# Patient Record
Sex: Female | Born: 1960 | Race: White | Hispanic: No | Marital: Single | Smoking: Never smoker
Health system: Southern US, Community
[De-identification: ages and names within clinical notes are randomized; demographics above are authoritative.]

## PROBLEM LIST (undated history)

## (undated) DIAGNOSIS — M4802 Spinal stenosis, cervical region: Secondary | ICD-10-CM

## (undated) DIAGNOSIS — M5030 Other cervical disc degeneration, unspecified cervical region: Secondary | ICD-10-CM

## (undated) DIAGNOSIS — F419 Anxiety disorder, unspecified: Secondary | ICD-10-CM

## (undated) DIAGNOSIS — K589 Irritable bowel syndrome without diarrhea: Secondary | ICD-10-CM

## (undated) DIAGNOSIS — I10 Essential (primary) hypertension: Secondary | ICD-10-CM

## (undated) DIAGNOSIS — R002 Palpitations: Secondary | ICD-10-CM

## (undated) DIAGNOSIS — G4700 Insomnia, unspecified: Secondary | ICD-10-CM

## (undated) DIAGNOSIS — R079 Chest pain, unspecified: Secondary | ICD-10-CM

## (undated) DIAGNOSIS — K219 Gastro-esophageal reflux disease without esophagitis: Secondary | ICD-10-CM

## (undated) HISTORY — DX: Other cervical disc degeneration, unspecified cervical region: M50.30

## (undated) HISTORY — DX: Palpitations: R00.2

## (undated) HISTORY — DX: Insomnia, unspecified: G47.00

## (undated) HISTORY — DX: Chest pain, unspecified: R07.9

## (undated) HISTORY — DX: Spinal stenosis, cervical region: M48.02

## (undated) HISTORY — DX: Irritable bowel syndrome, unspecified: K58.9

## (undated) HISTORY — DX: Essential (primary) hypertension: I10

## (undated) HISTORY — DX: Anxiety disorder, unspecified: F41.9

## (undated) HISTORY — PX: CATARACT EXTRACTION: SUR2

---

## 2002-04-23 ENCOUNTER — Encounter: Payer: Self-pay | Admitting: Family Medicine

## 2002-04-23 ENCOUNTER — Ambulatory Visit (HOSPITAL_COMMUNITY): Admission: RE | Admit: 2002-04-23 | Discharge: 2002-04-23 | Payer: Self-pay | Admitting: Family Medicine

## 2003-04-26 ENCOUNTER — Ambulatory Visit (HOSPITAL_COMMUNITY): Admission: RE | Admit: 2003-04-26 | Discharge: 2003-04-26 | Payer: Self-pay

## 2003-04-28 ENCOUNTER — Ambulatory Visit (HOSPITAL_COMMUNITY): Admission: RE | Admit: 2003-04-28 | Discharge: 2003-04-28 | Payer: Self-pay

## 2008-03-29 ENCOUNTER — Encounter: Admission: RE | Admit: 2008-03-29 | Discharge: 2008-03-29 | Payer: Self-pay | Admitting: Internal Medicine

## 2008-09-09 ENCOUNTER — Ambulatory Visit: Payer: Self-pay | Admitting: Cardiology

## 2008-12-27 ENCOUNTER — Ambulatory Visit: Payer: Self-pay | Admitting: Cardiology

## 2009-04-13 DIAGNOSIS — R002 Palpitations: Secondary | ICD-10-CM | POA: Insufficient documentation

## 2009-04-13 DIAGNOSIS — R079 Chest pain, unspecified: Secondary | ICD-10-CM | POA: Insufficient documentation

## 2009-12-13 ENCOUNTER — Telehealth (INDEPENDENT_AMBULATORY_CARE_PROVIDER_SITE_OTHER): Payer: Self-pay | Admitting: *Deleted

## 2010-08-02 NOTE — Progress Notes (Signed)
Summary: CLARIFY PROPRANOLOL DOSE  Phone Note From Pharmacy   [facility]  [address]  [phone]* Call For: DOCTOR  Summary of Call: pharmacy states that patient requesting propranolol 10mg  that she take daily and has been getting from them refills. Office note states she is on 80mg . Please clarify which dose she is to be on.   Initial call taken by: Carlye Grippe,  December 13, 2009 9:46 AM  Follow-up for Phone Call        it would be fine to give her prescription for 10 mg daily propranolol. I look sure when the dose was changed but he may ask her. Date the medication dosing in the chart. Follow-up by: Lewayne Bunting, MD, [facility],  December 13, 2009 2:15 PM    New/Updated Medications: PROPRANOLOL HCL 10 MG TABS (PROPRANOLOL HCL) Take 1 tablet by mouth once a day Prescriptions: PROPRANOLOL HCL 10 MG TABS (PROPRANOLOL HCL) Take 1 tablet by mouth once a day  #30 x 6   Entered by:   Carlye Grippe   Authorized by:   Lewayne Bunting, MD, [facility]   Signed by:   Carlye Grippe on 12/13/2009   Method used:   [facility]        [facility]  [phone]* (retail)       [address] [place], [place]  [address]       Ph: [phone] or [phone]       Fax: [phone]   RxID:   [id number]

## 2010-10-03 ENCOUNTER — Other Ambulatory Visit: Payer: Self-pay | Admitting: *Deleted

## 2010-10-03 MED ORDER — PROPRANOLOL HCL ER 80 MG PO CP24: 80.0000 mg | ORAL_CAPSULE | Freq: Every day | ORAL | Status: DC

## 2010-10-03 NOTE — Telephone Encounter (Signed)
Patient request refill on Propranolol.  Advised her that she had not been seen since 12/27/2008.  Will need to make appointment.  Will refill med to get to next visit.  Scheduled OV for 6/7 with GD.  Patient verbalized understanding.

## 2010-10-26 ENCOUNTER — Ambulatory Visit: Payer: Self-pay | Admitting: Cardiology

## 2010-11-13 NOTE — Assessment & Plan Note (Signed)
 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  We have seen the patient for insomnia and palpitations likely  secondary in the setting of generalized anxiety disorder and also  underlying irritable bowel syndrome.  The patient has been experiencing  significant stress, which is partly job related.  As her insomnia was  very bothersome and she was never rested.  We gave the patient  trazodone, which helped, but she has stopped this now.  She is only on  propranolol LA mainly for palpitations.  She has felt significant relief  of her palpitations with Inderal LA and on occasion will take short-  acting Inderal at work, also when her stress levels rise.  She denies  however any chest pain on exertion, orthopnea, or PND.  We felt during  the last office visit that she does not need stress test, as she has a  low pretest probability for coronary artery disease.   MEDICATIONS:  Propranolol LA 80 mg p.o. at bedtime.   PHYSICAL EXAMINATION:  VITAL SIGNS:  Blood pressure 125/79, heart rate  67.  GENERAL:  White female in no apparent distress.  HEENT:  Pupils, eyes clear; conjunctivae clear.  Conjunctivae clear.  NECK:  Supple.  Normal carotid upstroke.  No carotid bruits.  LUNGS:  Clear breath sounds bilaterally.  HEART:  Regular rate and rhythm.  Normal S1 and S2.  No murmurs, rubs,  or gallops.  ABDOMEN:  Soft and nontender.  No rebound or guarding.  Good bowel  sounds.  EXTREMITIES:  No cyanosis, clubbing or edema.  NEUROLOGIC:  The patient is alert, oriented and grossly nonfocal.   PROBLEMS:  1. Insomnia, resolved.  2. Generalized anxiety disorder.  3. Palpitations  secondary to generalized anxiety disorder, resolved on      propranolol.  4. Atypical chest pain, noncardiac.  5. Irritable bowel syndrome.   PLAN:  1. The patient is doing remarkably well with her Inderal LA, which has      helped her with her anxiety, it also helps her sleep at night.  Her      sleep pattern has much improved.  2. The patient has no chest pain after concerning for angina, I do not      thinks she needs stress      testing at the present time.  3. The patient can follow up with [place] on a p.r.n. basis.     Learta Codding, MD,FACC  Electronically Signed    GED/MedQ  DD: 12/27/2008  DT: 12/28/2008  Job #: [id number]   cc:   Gilmore Laroche, MD

## 2010-11-13 NOTE — Assessment & Plan Note (Signed)
[facility]                          [facility] OFFICE NOTE   NAME:Wood, Gina                          MRN:          [id number]  DATE:09/09/2008                            DOB:          1960/11/13    REFERRING PHYSICIAN:  Broadus John T. Pamalee Leyden, MD   REASON FOR CONSULTATION:  Concerned about blood pressure.   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  Lately she has been under quite a bit of stress  due to work-related circumstances.  Particularly, over the last 2 years  when she took the job at [employer], this occurred at around same time her  father died.  The patient finds herself to get very anxious at times  during the day due to stress.  She does have extreme ruminating thoughts  at night and has a hard time sleeping.  She does drink couple glasses of  the wine in the evening mainly to relax herself, but only time to find  herself sleeping immediately and wake up 2 hours later being wide awake.  She then stays up several hours during nighttime and start actually  working.  She also has checking behavior making sure her doors are  closed and also her car has been locked.  Sometimes she questions if she  is forgetful.  Under stressful circumstances, she experiences chest  pressure, but there is no definite exertional chest pain or shortness of  breath.  Her cardiac risk factor profile is short of her blood pressure  quite low.  She has reported HDL of 60-65.  Before the winter, the  patient exercised 3 times a week with fast walking.  When she finds  herself in stressful situations during the daytime, she will take a 10  mg dose of propranolol and with immediate resolution of her stress  relief.  Despite the fact that she has been anxious lately, her IBS has  been under very good control.  She does have significant symptoms of  GERD related to either snacking late at night as well as  drinking wine,  and having late meals.  She also watches TV in bed and falls asleep with  TV on.  Next, the patient also reports palpitations, but no orthopnea,  PND, presyncope or syncope.   PAST MEDICAL HISTORY:  1. Occasional IBS symptoms.  2. Questionable history of anxiety.  3. Spotting between cycles.  4. Bell palsy in 1987.  5. History of skin cancer removal.   ALLERGIES:  PENICILLIN causes hives.   FAMILY HISTORY:  Her mother is in good health.  Father died of heart  disease, also prostate cancer.   SOCIAL HISTORY:  The patient is not married.  She lives in [place].  She does not smoke.  She occasionally drinks alcohol, socially in the  evening as outlined above.   REVIEW OF SYSTEMS:  Otherwise negative and has been reviewed.   PHYSICAL EXAMINATION:  VITAL SIGNS:  Blood pressure is on 162/93, heart  rate 88 beats per minute.  Height is 5 feet  4 inches.  GENERAL:  Overweight white female, but in no apparent distress.  HEENT:  Pupils; eyes are clear.  Conjunctivae are clear.  NECK:  Supple.  Normal carotid upstroke.  No carotid bruits.  No  definite thyromegaly.  LUNGS:  Clear breath sounds bilaterally.  HEART:  Regular rate and rhythm.  Normal S1 and S2.  No murmur, rubs, or  gallops.  ABDOMEN:  Soft and nontender.  No rebound or guarding.  Good bowel  sounds.  EXTREMITIES:  No cyanosis, clubbing, or edema.  NEUROLOGIC:  The patient alert and grossly nonfocal.   PROBLEM LIST:  1. Insomnia.  2. Generalized anxiety disorder.  3. Palpitations secondary to above.  4. Chest pain secondary to above.  5. Irritable bowel syndrome.   PLAN:  1. At this point, I think it is very important to restore the      patient's sleep pattern, which is clearly a problem.  Although, she      does not want to take any type of the antipsychotropic drugs, I do      feel a small dose of trazodone would be most helpful.  I also      discouraged her from drinking alcohol in the evening  as this will      all induce sleep, but will not give a good night rest.  She can      increase her trazodone to 100 mg p.o. daily.  I have told her that      also this drug is non addictive.  2. The patient has a dramatic response to small dose of propranolol      and in light of her hypertension we will start her on Propranolol      LA 80 mg p.o. daily.  I have asked the patient to check her blood      pressure and she can come back next week for a nurse visit to have      her blood pressure evaluated.  3. At this point, I do not think any ischemia testing is needed and we      will follow the patient clinically.     Learta Codding, MD,FACC  Electronically Signed    GED/MedQ  DD: 09/09/2008  DT: 09/10/2008  Job #: [id number]   cc:   W. Madison Hickman, MD

## 2010-12-05 ENCOUNTER — Encounter: Payer: Self-pay | Admitting: Cardiology

## 2010-12-06 ENCOUNTER — Ambulatory Visit (INDEPENDENT_AMBULATORY_CARE_PROVIDER_SITE_OTHER): Payer: PRIVATE HEALTH INSURANCE | Admitting: Cardiology

## 2010-12-06 ENCOUNTER — Encounter: Payer: Self-pay | Admitting: Cardiology

## 2010-12-06 VITALS — BP 124/83 | HR 67 | Ht 63.0 in | Wt 175.0 lb

## 2010-12-06 DIAGNOSIS — K589 Irritable bowel syndrome without diarrhea: Secondary | ICD-10-CM | POA: Insufficient documentation

## 2010-12-06 DIAGNOSIS — R002 Palpitations: Secondary | ICD-10-CM

## 2010-12-06 MED ORDER — HYOSCYAMINE SULFATE 0.125 MG SL SUBL: SUBLINGUAL_TABLET | SUBLINGUAL | Status: DC

## 2010-12-06 NOTE — Assessment & Plan Note (Signed)
Essentially resolved and doing well on long-acting Inderal 80 mg by mouth each bedtime.

## 2010-12-06 NOTE — Patient Instructions (Addendum)
   Continue all current medications.   May use Levsin as needed   Your physician wants you to follow up in:  1 year.  You will receive a reminder letter in the mail one-two months in advance.  If you don't receive a letter, please call our office to schedule the follow up appointment

## 2010-12-06 NOTE — Progress Notes (Signed)
HPI Patient is a 50 year old female with a prior history of some symptoms of irritable bowel syndrome insomnia palpitations. This previously occurred in the setting of significant stress. When she was seen in the office last time in June of 2010 she had significant improved and did not require any further trazodone for insomnia. She continued her Inderal LA which relieved her palpitations and she would take on occasion short-acting Inderal if she had palpitations and increased stress at work. Her last office visit we did not feel that she needed a stress test that showed low probability for coronary artery disease. From a cardiac standpoint has been doing well. She has had no significant recurrent palpitations. She still has problems however with symptoms of IBS, particularly related to stress and certain food items.  Allergies  Allergen Reactions  . Penicillins Hives    Current Outpatient Prescriptions on File Prior to Visit  Medication Sig Dispense Refill  . propranolol (INDERAL LA) 80 MG 24 hr capsule Take 1 capsule (80 mg total) by mouth daily.  30 capsule  3    Past Medical History  Diagnosis Date  . Chest pain, unspecified   . Palpitations   . IBS (irritable bowel syndrome)   . Insomnia   . Anxiety disorder     No past surgical history on file.  No family history on file.  History   Social History  . Marital Status: Single    Spouse Name: N/A    Number of Children: N/A  . Years of Education: N/A   Occupational History  . [employer]    Social History Main Topics  . Smoking status: Never Smoker   . Smokeless tobacco: Never Used  . Alcohol Use: Not on file  . Drug Use: Not on file  . Sexually Active: Not on file   Other Topics Concern  . Not on file   Social History Narrative  . No narrative on file    PHYSICAL EXAM BP 124/83  Pulse 67  Ht 5\' 3"  (1.6 m)  Wt 175 lb (79.379 kg)  BMI 31.00 kg/m2  General: Well-developed, well-nourished in no  distress Head: Normocephalic and atraumatic Eyes:PERRLA/EOMI intact, conjunctiva and lids normal Ears: No deformity or lesions Mouth:normal dentition, normal posterior pharynx Neck: Supple, no JVD.  No masses, thyromegaly or abnormal cervical nodes Lungs: Normal breath sounds bilaterally without wheezing.  Normal percussion Cardiac: regular rate and rhythm with normal S1 and S2, no S3 or S4.  PMI is normal.  No pathological murmurs Abdomen: Normal bowel sounds, abdomen is soft and nontender without masses, organomegaly or hernias noted.  No hepatosplenomegaly MSK: Back normal, normal gait muscle strength and tone normal Vascular: Pulse is normal in all 4 extremities Extremities: No peripheral pitting edema Neurologic: Alert and oriented x 3 Skin: Intact without lesions or rashes Lymphatics: No significant adenopathy Psychologic: Normal affect  ECG: Normal sinus rhythm normal EKG  ASSESSMENT AND PLAN

## 2010-12-06 NOTE — Assessment & Plan Note (Signed)
I gave the patient information regarding consideration of antibiotic therapy for irritable bowel syndrome if she has particularly significant bloating. I told her to make a GI appointment if she would consider this. She may need some hydrogen breath test first. I also gave her information regarding the FODMAP diet which can provide significant symptom relief.

## 2010-12-13 ENCOUNTER — Telehealth: Payer: Self-pay | Admitting: *Deleted

## 2010-12-13 NOTE — Telephone Encounter (Signed)
Message left on voice mail that she wants to go back on Inderal the way she previously was taking.  States the slow release is giving her a headache and not slowing down her heartrate at night.  Heart not feeling right.

## 2010-12-14 ENCOUNTER — Other Ambulatory Visit: Payer: Self-pay | Admitting: *Deleted

## 2010-12-14 MED ORDER — PROPRANOLOL HCL 80 MG PO TABS: 80.0000 mg | ORAL_TABLET | Freq: Every day | ORAL | Status: DC

## 2010-12-14 NOTE — Telephone Encounter (Signed)
Spoke with patient and she requested original rx be called in. Patient stated that MD informed her that she could switch back if she needed to.

## 2010-12-14 NOTE — Telephone Encounter (Signed)
Patient request plain inderal saying the inderal la made her feel bad. Patient informed that original inderal 80mg  would be called in.

## 2011-07-04 ENCOUNTER — Other Ambulatory Visit: Payer: Self-pay | Admitting: Cardiology

## 2012-01-21 ENCOUNTER — Other Ambulatory Visit: Payer: Self-pay | Admitting: Cardiology

## 2012-01-21 ENCOUNTER — Telehealth: Payer: Self-pay | Admitting: *Deleted

## 2012-01-21 NOTE — Telephone Encounter (Signed)
Patient left message on machine yesterday evening at 4:25.  Requesting refill on Propranolol 10mg  [facility], but has the 80mg  ([phone]).  Returned call this morning.  Patient requesting refill on Propranolol 10mg .  States that GD had given this to her for as needed.  Informed her that this medication was not listed in her med list & that she had not been seen in over a year.  Advised her that I could not refill without MD okay.  Offered to scheduled OV.  Placed call on hold to notify girls up front for scheduling, call dropped off before they picked up.

## 2012-01-21 NOTE — Telephone Encounter (Signed)
Opened in err

## 2012-01-21 NOTE — Telephone Encounter (Signed)
[facility]

## 2012-01-22 ENCOUNTER — Telehealth: Payer: Self-pay | Admitting: Cardiology

## 2012-01-22 NOTE — Telephone Encounter (Signed)
Patient called and wanting to schedule appointment. Patient knows that appointment is with Gene and that it is for August 14th, 2013 at 2:00 PM.

## 2012-01-30 ENCOUNTER — Other Ambulatory Visit: Payer: Self-pay | Admitting: Cardiology

## 2012-02-12 ENCOUNTER — Ambulatory Visit: Payer: PRIVATE HEALTH INSURANCE | Admitting: Physician Assistant

## 2012-02-12 ENCOUNTER — Other Ambulatory Visit: Payer: Self-pay | Admitting: *Deleted

## 2012-02-12 MED ORDER — PROPRANOLOL HCL 80 MG PO TABS: 80.0000 mg | ORAL_TABLET | Freq: Every day | ORAL | Status: DC

## 2013-01-25 ENCOUNTER — Other Ambulatory Visit: Payer: Self-pay | Admitting: Cardiology

## 2013-02-22 ENCOUNTER — Telehealth: Payer: Self-pay

## 2013-02-22 NOTE — Telephone Encounter (Signed)
Gina Wood walked into office asking to be seen today or tomorrow to get refills for her medication.  I explained to her that I didn't have anything that soon and I would have to give her first available with Dr. Purvis Sheffield or Dr. Wyline Mood and that they nurses would fill her RX up until she kept her appointment. Once she had been seen they would give her more refills.  She told me no and that she wanted to see Gina Serpe, PA and I explained to her that Gina did not have anything available either with this being his last week.    She started walking out the office and I tried calling her back to try to resolve her urgency and she refused.    She called back and spoke with Gina Serpe, PA and he agreed to see her this Wednesday at 1:00pm.

## 2013-02-24 ENCOUNTER — Ambulatory Visit (INDEPENDENT_AMBULATORY_CARE_PROVIDER_SITE_OTHER): Payer: PRIVATE HEALTH INSURANCE | Admitting: Physician Assistant

## 2013-02-24 ENCOUNTER — Encounter: Payer: Self-pay | Admitting: Physician Assistant

## 2013-02-24 VITALS — BP 132/81 | HR 81 | Ht 63.0 in | Wt 175.0 lb

## 2013-02-24 DIAGNOSIS — I10 Essential (primary) hypertension: Secondary | ICD-10-CM | POA: Insufficient documentation

## 2013-02-24 DIAGNOSIS — R002 Palpitations: Secondary | ICD-10-CM

## 2013-02-24 MED ORDER — PROPRANOLOL HCL 10 MG PO TABS: 10.0000 mg | ORAL_TABLET | ORAL | Status: DC | PRN

## 2013-02-24 MED ORDER — PROPRANOLOL HCL 80 MG PO TABS: 80.0000 mg | ORAL_TABLET | Freq: Every day | ORAL | Status: DC

## 2013-02-24 NOTE — Patient Instructions (Signed)
   Refills sent to pharmacy for Inderal 10mg  & 80mg  tabs Continue all medications.   Your physician wants you to follow up in:  1 year.  You will receive a reminder letter in the mail one-two months in advance.  If you don't receive a letter, please call our office to schedule the follow up appointment

## 2013-02-24 NOTE — Progress Notes (Signed)
Primary Cardiologist: Prentice Docker, MD (new)   HPI: Patient returns to clinic to reestablish with our group, last seen here in June 2012, by Dr. Andee Lineman. She has history of palpitations, treated with long-acting propanolol, and no known history of structural heart disease.  She reports recent episode of recurrent palpitations, in the context of a mild URI, which she states has been the basis for prior episodes of breakthrough palpitations. This has since resolved. She denies any associated symptoms with these brief episodes, which last only a few seconds in duration, but can recur several times during the day. She had run out of her low-dose propanolol, which she had been previously instructed to use as needed for palpitations. She has also since run out of her long-acting 80 mg dose of propanolol.  She otherwise denies any history of exertional CP or SOB. She has never had an echocardiogram, or undergone an exercise stress test. Symptoms  Allergies  Allergen Reactions  . Penicillins Hives    Current Outpatient Prescriptions  Medication Sig Dispense Refill  . famotidine (PEPCID) 20 MG tablet Take 20 mg by mouth as needed.        . hyoscyamine (LEVSIN/SL) 0.125 MG SL tablet May use one tab sublingual up to three times per day as needed  30 tablet  1  . propranolol (INDERAL) 10 MG tablet Take 1 tablet (10 mg total) by mouth as needed.  30 tablet  11  . propranolol (INDERAL) 80 MG tablet Take 1 tablet (80 mg total) by mouth daily.  30 tablet  11   No current facility-administered medications for this visit.    Past Medical History  Diagnosis Date  . Chest pain, unspecified   . Palpitations   . IBS (irritable bowel syndrome)   . Insomnia   . Anxiety disorder   . HTN (hypertension)     No past surgical history on file.  History   Social History  . Marital Status: Single    Spouse Name: N/A    Number of Children: N/A  . Years of Education: N/A   Occupational History  .  [employer]    Social History Main Topics  . Smoking status: Never Smoker   . Smokeless tobacco: Never Used  . Alcohol Use: Not on file  . Drug Use: Not on file  . Sexual Activity: Not on file   Other Topics Concern  . Not on file   Social History Narrative  . No narrative on file    No family history on file.  ROS: no nausea, vomiting; no fever, chills; no melena, hematochezia; no claudication  PHYSICAL EXAM: BP 132/81  Pulse 81  Ht 5\' 3"  (1.6 m)  Wt 175 lb (79.379 kg)  BMI 31.01 kg/m2  SpO2 98% GENERAL: 52 year old female; NAD HEENT: NCAT, PERRLA, EOMI; sclera clear; no xanthelasma NECK: palpable bilateral carotid pulses, no bruits; no JVD; no TM LUNGS: CTA bilaterally CARDIAC: RRR (S1, S2); no significant murmurs; no rubs or gallops ABDOMEN: soft, protuberant EXTREMETIES: no significant peripheral edema SKIN: warm/dry; no obvious rash/lesions MUSCULOSKELETAL: no joint deformity NEURO: no focal deficit; NL affect   EKG:    ASSESSMENT & PLAN:  PALPITATIONS Will renew prescriptions for both short-acting and long-acting propanolol tablets. Patient declined further evaluation at this time with blood work, to assess electrolytes and TSH level, or a baseline echocardiogram, given that she has never been previously evaluated with such. However, she did agree to proceed with these recommendations, in the event of recurrent  palpitations.  HTN (hypertension) Stable on current dose of propanolol.    Gene Lynn Recendiz, PAC

## 2013-02-24 NOTE — Assessment & Plan Note (Signed)
Stable on current dose of propanolol.

## 2013-02-24 NOTE — Assessment & Plan Note (Signed)
Will renew prescriptions for both short-acting and long-acting propanolol tablets. Patient declined further evaluation at this time with blood work, to assess electrolytes and TSH level, or a baseline echocardiogram, given that she has never been previously evaluated with such. However, she did agree to proceed with these recommendations, in the event of recurrent palpitations.

## 2014-02-04 ENCOUNTER — Other Ambulatory Visit: Payer: Self-pay | Admitting: *Deleted

## 2014-02-04 MED ORDER — PROPRANOLOL HCL 80 MG PO TABS: 80.0000 mg | ORAL_TABLET | Freq: Every day | ORAL | Status: DC

## 2014-03-08 ENCOUNTER — Other Ambulatory Visit: Payer: Self-pay | Admitting: *Deleted

## 2014-03-08 MED ORDER — PROPRANOLOL HCL 10 MG PO TABS: 10.0000 mg | ORAL_TABLET | ORAL | Status: DC | PRN

## 2014-11-01 ENCOUNTER — Encounter: Payer: Self-pay | Admitting: Cardiovascular Disease

## 2014-11-01 ENCOUNTER — Ambulatory Visit (INDEPENDENT_AMBULATORY_CARE_PROVIDER_SITE_OTHER): Payer: 59 | Admitting: Cardiovascular Disease

## 2014-11-01 VITALS — BP 120/86 | HR 65 | Ht 64.0 in

## 2014-11-01 DIAGNOSIS — R002 Palpitations: Secondary | ICD-10-CM | POA: Diagnosis not present

## 2014-11-01 DIAGNOSIS — I10 Essential (primary) hypertension: Secondary | ICD-10-CM | POA: Diagnosis not present

## 2014-11-01 MED ORDER — PROPRANOLOL HCL 80 MG PO TABS: 80.0000 mg | ORAL_TABLET | Freq: Every day | ORAL | Status: DC

## 2014-11-01 NOTE — Progress Notes (Signed)
Patient ID: Gina Wood, female   DOB: 11/27/1960, 54 y.o.   MRN: [id number]      SUBJECTIVE: The patient is a 54 year old female who was most recently seen by our group in August 2014. She has a history of palpitations and hypertension.  She has been doing very well and denies chest pain, leg swelling, orthopnea, lightheadedness, dizziness, syncope, and paroxysmal nocturnal dyspnea. She has not experienced palpitations in 5 months.  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 She said while she has a stressful job, she enjoys it.  She has taken propranolol for many years and said it provides a "calming effect". It also helps control her blood pressure.  ECG performed in the office today demonstrates normal sinus rhythm and no arrhythmias.   Review of Systems: As per "subjective", otherwise negative.  Allergies  Allergen Reactions  . Penicillins Hives    Current Outpatient Prescriptions  Medication Sig Dispense Refill  . omeprazole (PRILOSEC OTC) 20 MG tablet Take 40 mg by mouth at bedtime.    . propranolol (INDERAL) 80 MG tablet Take 1 tablet (80 mg total) by mouth daily. 30 tablet 1   No current facility-administered medications for this visit.    Past Medical History  Diagnosis Date  . Chest pain, unspecified   . Palpitations   . IBS (irritable bowel syndrome)   . Insomnia   . Anxiety disorder   . HTN (hypertension)     No past surgical history on file.  History   Social History  . Marital Status: Single    Spouse Name: N/A  . Number of Children: N/A  . Years of Education: N/A   Occupational History  . [employer]    Social History Main Topics  . Smoking status: Never Smoker   . Smokeless tobacco: Never Used  . Alcohol Use: Not on file  . Drug Use: Not on file  . Sexual Activity: Not on file   Other  Topics Concern  . Not on file   Social History Narrative     Filed Vitals:   11/01/14 0809  BP: 120/86  Pulse: 65  Height: 5\' 4"  (1.626 m)    PHYSICAL EXAM General: NAD HEENT: Normal. Neck: No JVD, no thyromegaly. Lungs: Clear to auscultation bilaterally with normal respiratory effort. CV: Nondisplaced PMI.  Regular rate and rhythm, normal S1/S2, no S3/S4, no murmur. No pretibial or periankle edema.  No carotid bruit.  Normal pedal pulses.  Abdomen: Soft, nontender, no hepatosplenomegaly, no distention.  Neurologic: Alert and oriented x 3.  Psych: Normal affect. Skin: Normal. Musculoskeletal: Normal range of motion, no gross deformities. Extremities: No clubbing or cyanosis.   ECG: Most recent ECG reviewed.      ASSESSMENT AND PLAN: 1. Palpitations: Symptomatically stable for several months on propranolol 80 mg. No changes to therapy indicated.  2. Essential HTN: Well controlled on propranolol. No changes.  Dispo: f/u 1 year.  Prentice DockerSuresh Milanie Rosenfield, M.D., F.A.C.C.

## 2014-11-01 NOTE — Patient Instructions (Signed)
Your physician wants you to follow-up in: 1 year with Dr Koneswaran You will receive a reminder letter in the mail two months in advance. If you don't receive a letter, please call our office to schedule the follow-up appointment.    Your physician recommends that you continue on your current medications as directed. Please refer to the Current Medication list given to you today.     Thank you for choosing [facility] !        

## 2015-11-03 ENCOUNTER — Ambulatory Visit (INDEPENDENT_AMBULATORY_CARE_PROVIDER_SITE_OTHER): Payer: 59 | Admitting: Cardiovascular Disease

## 2015-11-03 ENCOUNTER — Encounter: Payer: Self-pay | Admitting: Cardiovascular Disease

## 2015-11-03 VITALS — BP 132/78 | HR 66 | Ht 63.0 in

## 2015-11-03 DIAGNOSIS — I10 Essential (primary) hypertension: Secondary | ICD-10-CM

## 2015-11-03 DIAGNOSIS — Z131 Encounter for screening for diabetes mellitus: Secondary | ICD-10-CM | POA: Diagnosis not present

## 2015-11-03 DIAGNOSIS — Z13220 Encounter for screening for lipoid disorders: Secondary | ICD-10-CM | POA: Diagnosis not present

## 2015-11-03 DIAGNOSIS — R002 Palpitations: Secondary | ICD-10-CM

## 2015-11-03 MED ORDER — PROPRANOLOL HCL 80 MG PO TABS: 80.0000 mg | ORAL_TABLET | Freq: Every day | ORAL | Status: DC

## 2015-11-03 NOTE — Progress Notes (Signed)
Patient ID: Gina Wood, female   DOB: 08/25/1960, 55 y.o.   MRN: [id number]      SUBJECTIVE: The patient presents for routine follow-up. She has a history of palpitations and hypertension.  She has been doing very well and denies chest pain, leg swelling, orthopnea, lightheadedness, dizziness, syncope, and paroxysmal nocturnal dyspnea.  She has not had any palpitations in the past 12 months. She requests labwork as she does not see a PCP.  ECG performed in the office today which I personally reviewed demonstrates normal sinus rhythm with no ischemic ST segment or T-wave abnormalities, nor any arrhythmias.   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 [place] in [place].   Review of Systems: As per "subjective", otherwise negative.  Allergies  Allergen Reactions  . Penicillins Hives    Current Outpatient Prescriptions  Medication Sig Dispense Refill  . omeprazole (PRILOSEC OTC) 20 MG tablet Take 40 mg by mouth at bedtime.    . propranolol (INDERAL) 80 MG tablet Take 1 tablet (80 mg total) by mouth daily. 30 tablet 11   No current facility-administered medications for this visit.    Past Medical History  Diagnosis Date  . Chest pain, unspecified   . Palpitations   . IBS (irritable bowel syndrome)   . Insomnia   . Anxiety disorder   . HTN (hypertension)     No past surgical history on file.  Social History   Social History  . Marital Status: Single    Spouse Name: N/A  . Number of Children: N/A  . Years of Education: N/A   Occupational History  . [employer]    Social History Main Topics  . Smoking status: Never Smoker   . Smokeless tobacco: Never Used  . Alcohol Use: Not on file  . Drug Use: Not on file  . Sexual Activity: Not on file   Other Topics Concern  . Not on file   Social History  Narrative     Filed Vitals:   11/03/15 0807  BP: 132/78  Pulse: 66  Height: 5\' 3"  (1.6 m)  SpO2: 95%    PHYSICAL EXAM General: NAD HEENT: Normal. Neck: No JVD, no thyromegaly. Lungs: Clear to auscultation bilaterally with normal respiratory effort. CV: Nondisplaced PMI.  Regular rate and rhythm, normal S1/S2, no S3/S4, no murmur. No pretibial or periankle edema.  No carotid bruit.   Abdomen: Soft, nontender, obese.  Neurologic: Alert and oriented.  Psych: Normal affect. Skin: Normal. Musculoskeletal: No gross deformities.    ECG: Most recent ECG reviewed.      ASSESSMENT AND PLAN: 1. Palpitations: Symptomatically stable for over a year on propranolol 80 mg. No changes to therapy indicated. As per her request, will check CBC, BMET, lipids, and HbA1C.  2. Essential HTN: Well controlled on propranolol. No changes.  3. Prevention: As per her request, will check CBC, BMET, lipids, and HbA1C.  Dispo: f/u 1 year.   Prentice DockerSuresh Brinsley Wence, M.D., F.A.C.C.

## 2015-11-03 NOTE — Patient Instructions (Signed)
Your physician wants you to follow-up in: 1 year You will receive a reminder letter in the mail two months in advance. If you don't receive a letter, please call our office to schedule the follow-up appointment.    Your physician recommends that you continue on your current medications as directed. Please refer to the Current Medication list given to you today.    If you need a refill on your cardiac medications before your next appointment, please call your pharmacy.    Get FASTING lab work     Thank you for [facility] !

## 2015-11-09 ENCOUNTER — Other Ambulatory Visit: Payer: Self-pay | Admitting: Cardiovascular Disease

## 2016-04-28 ENCOUNTER — Ambulatory Visit (HOSPITAL_COMMUNITY)
Admission: EM | Admit: 2016-04-28 | Discharge: 2016-04-28 | Disposition: A | Discharge status: Home / Self Care | Payer: 59 | Attending: Internal Medicine | Admitting: Internal Medicine

## 2016-04-28 ENCOUNTER — Encounter (HOSPITAL_COMMUNITY): Payer: Self-pay | Admitting: Emergency Medicine

## 2016-04-28 ENCOUNTER — Ambulatory Visit (INDEPENDENT_AMBULATORY_CARE_PROVIDER_SITE_OTHER): Payer: 59

## 2016-04-28 DIAGNOSIS — S62101A Fracture of unspecified carpal bone, right wrist, initial encounter for closed fracture: Secondary | ICD-10-CM

## 2016-04-28 MED ORDER — IBUPROFEN 800 MG PO TABS
800.0000 mg | ORAL_TABLET | Freq: Once | ORAL | Status: AC
  Administered 2016-04-28: 800 mg via ORAL

## 2016-04-28 MED ORDER — IBUPROFEN 800 MG PO TABS
ORAL_TABLET | ORAL | Status: AC
  Filled 2016-04-28: qty 1

## 2016-04-28 NOTE — Progress Notes (Signed)
Orthopedic Tech Progress Note Patient Details:  Gina Wood 10/22/1960 [id number]  Ortho Devices Type of Ortho Device: Ace wrap, Arm sling, Sugartong splint Ortho Device/Splint Location: rue Ortho Device/Splint Interventions: Application   Larrisha Babineau 04/28/2016, 3:37 PM

## 2016-04-28 NOTE — Discharge Instructions (Signed)
ORTHO WILL SEE YOU THIS WEEK TO DETERMINE IS SURGERY IS NEEDED.   KEEP ELEVATED  COLD COMPRESSES FOR SWELLING  TYLENOL FOR PAIN

## 2016-04-28 NOTE — ED Triage Notes (Signed)
The [facility] with a complaint of right wrist pain secondary to a fall that occurred today. The patient stated that she tripped over a bed rail and struck her right arm on the bed railing when she fell.

## 2016-04-28 NOTE — ED Notes (Signed)
Ortho Tech paged.

## 2016-04-28 NOTE — ED Notes (Signed)
Ortho tech advised will be otw shortly.

## 2016-04-28 NOTE — ED Provider Notes (Signed)
CSN: [id number]     Arrival date & time 04/28/16  1229 History   First MD Initiated Contact with Patient 04/28/16 1329     Chief Complaint  Patient presents with  . Fall   (Consider location/radiation/quality/duration/timing/severity/associated sxs/prior Treatment) HPI NP 55 Y/O FEMALE FOOSH RIGHT HAND TODAY ABOUT 1 HOUR PRIOR TO ARRIVAL.  STATES SHE TRIPPED OVER A BED POST.  Past Medical History:  Diagnosis Date  . Anxiety disorder   . Chest pain, unspecified   . HTN (hypertension)   . IBS (irritable bowel syndrome)   . Insomnia   . Palpitations    History reviewed. No pertinent surgical history. History reviewed. No pertinent family history. Social History  Substance Use Topics  . Smoking status: Never Smoker  . Smokeless tobacco: Never Used  . Alcohol use Not on file   OB History    No data available     Review of Systems  Denies: HEADACHE, NAUSEA, ABDOMINAL PAIN, CHEST PAIN, CONGESTION, DYSURIA, SHORTNESS OF BREATH  Allergies  Penicillins  Home Medications   Prior to Admission medications   Medication Sig Start Date End Date Taking? Authorizing Provider  omeprazole (PRILOSEC OTC) 20 MG tablet Take 40 mg by mouth at bedtime.   Yes Historical Provider, MD  propranolol (INDERAL) 80 MG tablet TAKE 1 TABLET BY MOUTH ONCE DAILY 11/09/15  Yes Laqueta LindenSuresh A Koneswaran, MD   Meds Ordered and Administered this Visit  Medications - No data to display  BP 144/77 (BP Location: Left Arm)   Pulse 61   Temp 98 F (36.7 C) (Oral)   Resp 18   SpO2 97%  No data found.   Physical Exam NURSES NOTES AND VITAL SIGNS REVIEWED. CONSTITUTIONAL: Well developed, well nourished, no acute distress HEENT: normocephalic, atraumatic EYES: Conjunctiva normal NECK:normal ROM, supple, no adenopathy PULMONARY:No respiratory distress, normal effort ABDOMINAL: Soft, ND, NT BS+, No CVAT MUSCULOSKELETAL: Normal ROM of all extremities, RIGHT DISTAL RADIUS, SWOLLEN, TENDER, NO VISIBLE OR  PALPABLE DEFORMITY.  SKIN: warm and dry without rash PSYCHIATRIC: Mood and affect, behavior are normal  Urgent Care Course   Clinical Course    Procedures (including critical care time)  Labs Review Labs Reviewed - No data to display  Imaging Review Dg Wrist Complete Right  Result Date: 04/28/2016 CLINICAL DATA:  Pain after trauma. EXAM: RIGHT WRIST - COMPLETE 3+ VIEW COMPARISON:  None. FINDINGS: There is a displaced fracture through the distal radial metaphysis extending into the radiocarpal joint. The dedicated view of the scaphoid is normal with no fracture. Fracture fragments are seen posteriorly along the wrist on the lateral view consistent with a a triquetrum fracture. The distal ulna and remainder of the carpal bones are unremarkable. The visualized metacarpal bones demonstrate no fractures. IMPRESSION: Displaced distal radius fracture. Fracture fragments seen posteriorly on the lateral view most consistent with a triquetrum fracture. Electronically Signed   By: Gerome Samavid  Williams III M.D   On: 04/28/2016 14:19     Visual Acuity Review  Right Eye Distance:   Left Eye Distance:   Bilateral Distance:    Right Eye Near:   Left Eye Near:    Bilateral Near:      DISCUSSED WITH DR. LANDAU, HE IS HAPPY FOR SUGAR TONG AND FOLLOW UP IN OFFICE BY NEXT Wednesday.   ORTHO TECH TO APPLY SUGAR TONG SPLINT.   MDM   1. Closed fracture of right wrist, initial encounter     Patient is reassured that there are no issues that require  transfer to higher level of care at this time or additional tests. Patient is advised to continue home symptomatic treatment. Patient is advised that if there are new or worsening symptoms to attend the emergency department, contact primary care provider, or return to UC. Instructions of care provided discharged home in stable condition.    THIS NOTE WAS GENERATED USING A VOICE RECOGNITION SOFTWARE PROGRAM. ALL REASONABLE EFFORTS  WERE MADE TO PROOFREAD  THIS DOCUMENT FOR ACCURACY.  I have verbally reviewed the discharge instructions with the patient. A printed AVS was given to the patient.  All questions were answered prior to discharge.      Tharon AquasFrank C Marika Mahaffy, PA 04/28/16 1444

## 2016-05-08 ENCOUNTER — Other Ambulatory Visit: Payer: Self-pay | Admitting: Orthopedic Surgery

## 2016-05-09 ENCOUNTER — Encounter (HOSPITAL_BASED_OUTPATIENT_CLINIC_OR_DEPARTMENT_OTHER): Payer: Self-pay | Admitting: *Deleted

## 2016-05-16 ENCOUNTER — Ambulatory Visit (HOSPITAL_BASED_OUTPATIENT_CLINIC_OR_DEPARTMENT_OTHER)
Admission: RE | Admit: 2016-05-16 | Discharge: 2016-05-16 | Disposition: A | Discharge status: Home / Self Care | Payer: 59 | Source: Ambulatory Visit | Attending: Orthopedic Surgery | Admitting: Orthopedic Surgery

## 2016-05-16 ENCOUNTER — Ambulatory Visit (HOSPITAL_BASED_OUTPATIENT_CLINIC_OR_DEPARTMENT_OTHER): Payer: 59 | Admitting: Anesthesiology

## 2016-05-16 ENCOUNTER — Encounter (HOSPITAL_BASED_OUTPATIENT_CLINIC_OR_DEPARTMENT_OTHER): Payer: Self-pay | Admitting: *Deleted

## 2016-05-16 ENCOUNTER — Encounter (HOSPITAL_BASED_OUTPATIENT_CLINIC_OR_DEPARTMENT_OTHER)
Admission: RE | Disposition: A | Discharge status: Home / Self Care | Payer: Self-pay | Source: Ambulatory Visit | Attending: Orthopedic Surgery

## 2016-05-16 DIAGNOSIS — F419 Anxiety disorder, unspecified: Secondary | ICD-10-CM | POA: Diagnosis not present

## 2016-05-16 DIAGNOSIS — G4700 Insomnia, unspecified: Secondary | ICD-10-CM | POA: Insufficient documentation

## 2016-05-16 DIAGNOSIS — Z880 Allergy status to penicillin: Secondary | ICD-10-CM | POA: Insufficient documentation

## 2016-05-16 DIAGNOSIS — W19XXXA Unspecified fall, initial encounter: Secondary | ICD-10-CM | POA: Insufficient documentation

## 2016-05-16 DIAGNOSIS — Y93E9 Activity, other interior property and clothing maintenance: Secondary | ICD-10-CM | POA: Insufficient documentation

## 2016-05-16 DIAGNOSIS — S52571A Other intraarticular fracture of lower end of right radius, initial encounter for closed fracture: Secondary | ICD-10-CM | POA: Insufficient documentation

## 2016-05-16 DIAGNOSIS — K589 Irritable bowel syndrome without diarrhea: Secondary | ICD-10-CM | POA: Insufficient documentation

## 2016-05-16 DIAGNOSIS — Z79899 Other long term (current) drug therapy: Secondary | ICD-10-CM | POA: Diagnosis not present

## 2016-05-16 DIAGNOSIS — Z6841 Body Mass Index (BMI) 40.0 and over, adult: Secondary | ICD-10-CM | POA: Diagnosis not present

## 2016-05-16 DIAGNOSIS — K219 Gastro-esophageal reflux disease without esophagitis: Secondary | ICD-10-CM | POA: Insufficient documentation

## 2016-05-16 HISTORY — DX: Gastro-esophageal reflux disease without esophagitis: K21.9

## 2016-05-16 HISTORY — PX: OPEN REDUCTION INTERNAL FIXATION (ORIF) DISTAL RADIAL FRACTURE: SHX5989

## 2016-05-16 SURGERY — OPEN REDUCTION INTERNAL FIXATION (ORIF) DISTAL RADIUS FRACTURE
Anesthesia: General | Site: Wrist | Laterality: Right

## 2016-05-16 MED ORDER — FENTANYL CITRATE (PF) 100 MCG/2ML IJ SOLN
INTRAMUSCULAR | Status: AC
  Filled 2016-05-16: qty 2

## 2016-05-16 MED ORDER — MEPERIDINE HCL 25 MG/ML IJ SOLN: 6.2500 mg | INTRAMUSCULAR | Status: DC | PRN

## 2016-05-16 MED ORDER — VANCOMYCIN HCL IN DEXTROSE 1-5 GM/200ML-% IV SOLN
1000.0000 mg | INTRAVENOUS | Status: AC
  Administered 2016-05-16: 1000 mg via INTRAVENOUS

## 2016-05-16 MED ORDER — VANCOMYCIN HCL IN DEXTROSE 1-5 GM/200ML-% IV SOLN
INTRAVENOUS | Status: AC
  Filled 2016-05-16: qty 200

## 2016-05-16 MED ORDER — MIDAZOLAM HCL 2 MG/2ML IJ SOLN
INTRAMUSCULAR | Status: AC
  Filled 2016-05-16: qty 2

## 2016-05-16 MED ORDER — LIDOCAINE 2% (20 MG/ML) 5 ML SYRINGE
INTRAMUSCULAR | Status: DC | PRN
  Administered 2016-05-16: 60 mg via INTRAVENOUS

## 2016-05-16 MED ORDER — EPHEDRINE SULFATE 50 MG/ML IJ SOLN
INTRAMUSCULAR | Status: DC | PRN
  Administered 2016-05-16: 10 mg via INTRAVENOUS

## 2016-05-16 MED ORDER — OXYCODONE-ACETAMINOPHEN 7.5-325 MG PO TABS: 1.0000 | ORAL_TABLET | ORAL | 0 refills | Status: DC | PRN

## 2016-05-16 MED ORDER — LACTATED RINGERS IV SOLN: INTRAVENOUS | Status: DC

## 2016-05-16 MED ORDER — FENTANYL CITRATE (PF) 100 MCG/2ML IJ SOLN: 25.0000 ug | INTRAMUSCULAR | Status: DC | PRN

## 2016-05-16 MED ORDER — PROPOFOL 10 MG/ML IV BOLUS
INTRAVENOUS | Status: DC | PRN
  Administered 2016-05-16: 200 mg via INTRAVENOUS

## 2016-05-16 MED ORDER — METOCLOPRAMIDE HCL 5 MG/ML IJ SOLN: 10.0000 mg | Freq: Once | INTRAMUSCULAR | Status: DC | PRN

## 2016-05-16 MED ORDER — MIDAZOLAM HCL 2 MG/2ML IJ SOLN
1.0000 mg | INTRAMUSCULAR | Status: DC | PRN
  Administered 2016-05-16: 2 mg via INTRAVENOUS

## 2016-05-16 MED ORDER — CHLORHEXIDINE GLUCONATE 4 % EX LIQD: 60.0000 mL | Freq: Once | CUTANEOUS | Status: DC

## 2016-05-16 MED ORDER — FENTANYL CITRATE (PF) 100 MCG/2ML IJ SOLN
50.0000 ug | INTRAMUSCULAR | Status: AC | PRN
  Administered 2016-05-16 (×2): 25 ug via INTRAVENOUS
  Administered 2016-05-16: 100 ug via INTRAVENOUS

## 2016-05-16 MED ORDER — LACTATED RINGERS IV SOLN
INTRAVENOUS | Status: DC
  Administered 2016-05-16 (×2): via INTRAVENOUS

## 2016-05-16 MED ORDER — SCOPOLAMINE 1 MG/3DAYS TD PT72: 1.0000 | MEDICATED_PATCH | Freq: Once | TRANSDERMAL | Status: DC | PRN

## 2016-05-16 SURGICAL SUPPLY — 61 items
BIT DRILL 2.0 LNG QUCK RELEASE (BIT) ×1 IMPLANT
BIT DRILL 2.8X5 QR DISP (BIT) ×2 IMPLANT
BLADE MINI RND TIP GREEN BEAV (BLADE) IMPLANT
BLADE SURG 15 STRL LF DISP TIS (BLADE) ×1 IMPLANT
BLADE SURG 15 STRL SS (BLADE) ×1
BNDG COHESIVE 3X5 TAN STRL LF (GAUZE/BANDAGES/DRESSINGS) ×2 IMPLANT
BNDG ESMARK 4X9 LF (GAUZE/BANDAGES/DRESSINGS) ×2 IMPLANT
BNDG GAUZE ELAST 4 BULKY (GAUZE/BANDAGES/DRESSINGS) ×2 IMPLANT
CHLORAPREP W/TINT 26ML (MISCELLANEOUS) ×2 IMPLANT
CORDS BIPOLAR (ELECTRODE) ×2 IMPLANT
COVER BACK TABLE 60X90IN (DRAPES) ×2 IMPLANT
COVER MAYO STAND STRL (DRAPES) ×2 IMPLANT
CUFF TOURNIQUET SINGLE 18IN (TOURNIQUET CUFF) ×2 IMPLANT
DECANTER SPIKE VIAL GLASS SM (MISCELLANEOUS) IMPLANT
DRAPE EXTREMITY T 121X128X90 (DRAPE) ×2 IMPLANT
DRAPE OEC MINIVIEW 54X84 (DRAPES) ×2 IMPLANT
DRAPE SURG 17X23 STRL (DRAPES) ×2 IMPLANT
DRILL 2.0 LNG QUICK RELEASE (BIT) ×2
GAUZE SPONGE 4X4 12PLY STRL (GAUZE/BANDAGES/DRESSINGS) ×2 IMPLANT
GAUZE XEROFORM 1X8 LF (GAUZE/BANDAGES/DRESSINGS) ×2 IMPLANT
GLOVE BIO SURGEON STRL SZ7.5 (GLOVE) ×2 IMPLANT
GLOVE BIOGEL PI IND STRL 7.0 (GLOVE) ×1 IMPLANT
GLOVE BIOGEL PI IND STRL 8 (GLOVE) ×2 IMPLANT
GLOVE BIOGEL PI IND STRL 8.5 (GLOVE) ×1 IMPLANT
GLOVE BIOGEL PI INDICATOR 7.0 (GLOVE) ×1
GLOVE BIOGEL PI INDICATOR 8 (GLOVE) ×2
GLOVE BIOGEL PI INDICATOR 8.5 (GLOVE) ×1
GLOVE SURG ORTHO 8.0 STRL STRW (GLOVE) ×2 IMPLANT
GLOVE SURG SYN 7.5  E (GLOVE) ×1
GLOVE SURG SYN 7.5 E (GLOVE) ×1 IMPLANT
GOWN STRL REUS W/ TWL LRG LVL3 (GOWN DISPOSABLE) ×1 IMPLANT
GOWN STRL REUS W/TWL 2XL LVL3 (GOWN DISPOSABLE) ×4 IMPLANT
GOWN STRL REUS W/TWL LRG LVL3 (GOWN DISPOSABLE) ×1
GOWN STRL REUS W/TWL XL LVL3 (GOWN DISPOSABLE) ×4 IMPLANT
GUIDEWIRE ORTHO 0.054X6 (WIRE) ×6 IMPLANT
NEEDLE PRECISIONGLIDE 27X1.5 (NEEDLE) IMPLANT
NS IRRIG 1000ML POUR BTL (IV SOLUTION) ×2 IMPLANT
PACK BASIN DAY SURGERY FS (CUSTOM PROCEDURE TRAY) ×2 IMPLANT
PAD CAST 3X4 CTTN HI CHSV (CAST SUPPLIES) ×1 IMPLANT
PADDING CAST COTTON 3X4 STRL (CAST SUPPLIES) ×1
PLATE ACULOCK 2 NARROW RT (Plate) ×2 IMPLANT
SCREW 2.3X12MM (Screw) ×2 IMPLANT
SCREW ACTK 2 NL HEX 3.5.11 (Screw) ×2 IMPLANT
SCREW CORTICAL LOCKING 2.3X14M (Screw) ×2 IMPLANT
SCREW CORTICAL LOCKING 2.3X16M (Screw) ×2 IMPLANT
SCREW CORTICAL LOCKING 2.3X18M (Screw) ×1 IMPLANT
SCREW FX16X2.3XLCK SMTH NS CRT (Screw) ×2 IMPLANT
SCREW FX18X2.3XSMTH LCK NS CRT (Screw) ×1 IMPLANT
SCREW NON TOGG 2.3X20MM (Screw) ×2 IMPLANT
SCREW NONLOCK HEX 3.5X12 (Screw) ×4 IMPLANT
SLEEVE SCD COMPRESS KNEE MED (MISCELLANEOUS) ×2 IMPLANT
SLING ARM FOAM STRAP MED (SOFTGOODS) ×2 IMPLANT
SPLINT PLASTER CAST XFAST 3X15 (CAST SUPPLIES) ×10 IMPLANT
SPLINT PLASTER XTRA FASTSET 3X (CAST SUPPLIES) ×10
STOCKINETTE 4X48 STRL (DRAPES) ×2 IMPLANT
SUT ETHILON 4 0 PS 2 18 (SUTURE) ×2 IMPLANT
SUT VICRYL 4-0 PS2 18IN ABS (SUTURE) ×2 IMPLANT
SYR BULB 3OZ (MISCELLANEOUS) ×2 IMPLANT
SYR CONTROL 10ML LL (SYRINGE) IMPLANT
TOWEL OR 17X24 6PK STRL BLUE (TOWEL DISPOSABLE) ×2 IMPLANT
UNDERPAD 30X30 (UNDERPADS AND DIAPERS) ×2 IMPLANT

## 2016-05-16 NOTE — Anesthesia Preprocedure Evaluation (Signed)
Anesthesia Evaluation  Patient identified by MRN, date of birth, ID band Patient awake    Reviewed: Allergy & Precautions, NPO status , Patient's Chart, lab work & pertinent test results  Airway Mallampati: II  TM Distance: >3 FB Neck ROM: Full    Dental no notable dental hx.    Pulmonary neg pulmonary ROS,    Pulmonary exam normal breath sounds clear to auscultation       Cardiovascular hypertension, Pt. on medications Normal cardiovascular exam Rhythm:Regular Rate:Normal     Neuro/Psych negative neurological ROS  negative psych ROS   GI/Hepatic Neg liver ROS, GERD  Medicated and Controlled,  Endo/Other  Morbid obesity  Renal/GU negative Renal ROS  negative genitourinary   Musculoskeletal negative musculoskeletal ROS (+)   Abdominal   Peds negative pediatric ROS (+)  Hematology negative hematology ROS (+)   Anesthesia Other Findings   Reproductive/Obstetrics negative OB ROS                             Anesthesia Physical Anesthesia Plan  ASA: III  Anesthesia Plan: General   Post-op Pain Management: GA combined w/ Regional for post-op pain   Induction: Intravenous  Airway Management Planned: LMA  Additional Equipment:   Intra-op Plan:   Post-operative Plan: Extubation in OR  Informed Consent: I have reviewed the patients History and Physical, chart, labs and discussed the procedure including the risks, benefits and alternatives for the proposed anesthesia with the patient or authorized representative who has indicated his/her understanding and acceptance.   Dental advisory given  Plan Discussed with: CRNA  Anesthesia Plan Comments: (SCB)        Anesthesia Quick Evaluation

## 2016-05-16 NOTE — Brief Op Note (Signed)
05/16/2016  4:26 PM  PATIENT:  Gina Wood  55 y.o. female  PRE-OPERATIVE DIAGNOSIS:  RIGHT DISTAL RADIUS FRACTURE  S52.571A  POST-OPERATIVE DIAGNOSIS:  RIGHT DISTAL RADIUS FRACTURE  S52.571A  PROCEDURE:  Procedure(s): OPEN REDUCTION INTERNAL FIXATION (ORIF) RIGHT  DISTAL RADIAL FRACTURE (Right)  SURGEON:  Surgeon(s) and Role:    * Cindee SaltGary Corianna Avallone, MD - Primary    * Betha LoaKevin Saydie Gerdts, MD - Assisting  PHYSICIAN ASSISTANT:   ASSISTANTS: K Erminie Foulks,MD   ANESTHESIA:   regional and general  EBL:  Total I/O In: 300 [I.V.:300] Out: -   BLOOD ADMINISTERED:none  DRAINS: none   LOCAL MEDICATIONS USED:  NONE  SPECIMEN:  No Specimen  DISPOSITION OF SPECIMEN:  N/A  COUNTS:  YES  TOURNIQUET:   Total Tourniquet Time Documented: Upper Arm (Right) - 56 minutes Total: Upper Arm (Right) - 56 minutes   DICTATION: .Other Dictation: Dictation Number dictated 05/16/16 @ 14:33  PLAN OF CARE: Discharge to home after PACU  PATIENT DISPOSITION:  PACU - hemodynamically stable.

## 2016-05-16 NOTE — Op Note (Signed)
Dictated 05/16/16 @ 16:33

## 2016-05-16 NOTE — Anesthesia Procedure Notes (Signed)
Anesthesia Regional Block:  Supraclavicular block  Pre-Anesthetic Checklist: ,, timeout performed, Correct Patient, Correct Site, Correct Laterality, Correct Procedure, Correct Position, site marked, Risks and benefits discussed,  Surgical consent,  Pre-op evaluation,  At surgeon's request and post-op pain management  Laterality: Right and Upper  Prep: Maximum Sterile Barrier Precautions used, chloraprep       Needles:  Injection technique: Single-shot  Needle Type: Echogenic Stimulator Needle     Needle Length: 10cm 10 cm Needle Gauge: 21 G    Additional Needles:  Procedures: ultrasound guided (picture in chart) Supraclavicular block Narrative:  Injection made incrementally with aspirations every 5 mL.  Performed by: Personally   Additional Notes: Risks, benefits and alternative to block explained extensively.  Patient tolerated procedure well, without complications.      

## 2016-05-16 NOTE — Transfer of Care (Signed)
Immediate Anesthesia Transfer of Care Note  Patient: Gina Wood  Procedure(s) Performed: Procedure(s): OPEN REDUCTION INTERNAL FIXATION (ORIF) RIGHT  DISTAL RADIAL FRACTURE (Right)  Patient Location: PACU  Anesthesia Type:GA combined with regional for post-op pain  Level of Consciousness: sedated  Airway & Oxygen Therapy: Patient Spontanous Breathing and Patient connected to face mask oxygen  Post-op Assessment: Report given to RN and Post -op Vital signs reviewed and stable  Post vital signs: Reviewed and stable  Last Vitals:  Vitals:   05/16/16 1404 05/16/16 1632  BP:  132/80  Pulse: (!) 57 (!) 58  Resp: 18 16  Temp:      Last Pain:  Vitals:   05/16/16 1249  TempSrc: Oral         Complications: No apparent anesthesia complications

## 2016-05-16 NOTE — Op Note (Signed)
I assisted Surgeon(s) and Role:    * Cindee SaltGary Kais Monje, MD - Primary    * Betha LoaKevin Merina Behrendt, MD - Assisting on the Procedure(s): OPEN REDUCTION INTERNAL FIXATION (ORIF) RIGHT  DISTAL RADIAL FRACTURE on 05/16/2016.  I provided assistance on this case as follows: retraction soft tissues, reduction of fracture, placement of hardware.  Electronically signed by: Tami RibasKUZMA,Kaian Fahs R, MD Date: 05/16/2016 Time: 4:28 PM

## 2016-05-16 NOTE — Discharge Instructions (Signed)
°Post Anesthesia Home Care Instructions ° °Activity: °Get plenty of rest for the remainder of the day. A responsible adult should stay with you for 24 hours following the procedure.  °For the next 24 hours, DO NOT: °-Drive a car °-Operate machinery °-Drink alcoholic beverages °-Take any medication unless instructed by your physician °-Make any legal decisions or sign important papers. ° °Meals: °Start with liquid foods such as gelatin or soup. Progress to regular foods as tolerated. Avoid greasy, spicy, heavy foods. If nausea and/or vomiting occur, drink only clear liquids until the nausea and/or vomiting subsides. Call your physician if vomiting continues. ° °Special Instructions/Symptoms: °Your throat may feel dry or sore from the anesthesia or the breathing tube placed in your throat during surgery. If this causes discomfort, gargle with warm salt water. The discomfort should disappear within 24 hours. ° °If you had a scopolamine patch placed behind your ear for the management of post- operative nausea and/or vomiting: ° °1. The medication in the patch is effective for 72 hours, after which it should be removed.  Wrap patch in a tissue and discard in the trash. Wash hands thoroughly with soap and water. °2. You may remove the patch earlier than 72 hours if you experience unpleasant side effects which may include dry mouth, dizziness or visual disturbances. °3. Avoid touching the patch. Wash your hands with soap and water after contact with the patch. °  °Regional Anesthesia Blocks ° °1. Numbness or the inability to move the "blocked" extremity may last from 3-48 hours after placement. The length of time depends on the medication injected and your individual response to the medication. If the numbness is not going away after 48 hours, call your surgeon. ° °2. The extremity that is blocked will need to be protected until the numbness is gone and the  Strength has returned. Because you cannot feel it, you will need  to take extra care to avoid injury. Because it may be weak, you may have difficulty moving it or using it. You may not know what position it is in without looking at it while the block is in effect. ° °3. For blocks in the legs and feet, returning to weight bearing and walking needs to be done carefully. You will need to wait until the numbness is entirely gone and the strength has returned. You should be able to move your leg and foot normally before you try and bear weight or walk. You will need someone to be with you when you first try to ensure you do not fall and possibly risk injury. ° °4. Bruising and tenderness at the needle site are common side effects and will resolve in a few days. ° °5. Persistent numbness or new problems with movement should be communicated to the surgeon or the [facility] ([phone])/ [facility] ([phone]). ° ° °Hand Center Instructions °Hand Surgery ° °Wound Care: °Keep your hand elevated above the level of your heart.  Do not allow it to dangle by your side.  Keep the dressing dry and do not remove it unless your doctor advises you to do so.  He will usually change it at the time of your post-op visit.  Moving your fingers is advised to stimulate circulation but will depend on the site of your surgery.  If you have a splint applied, your doctor will advise you regarding movement. ° °Activity: °Do not drive or operate machinery today.  Rest today and then you may return   to your normal activity and work as indicated by your physician. ° °Diet:  °Drink liquids today or eat a light diet.  You may resume a regular diet tomorrow.   ° °General expectations: °Pain for two to three days. °Fingers may become slightly swollen. ° °Call your doctor if any of the following occur: °Severe pain not relieved by pain medication. °Elevated temperature. °Dressing soaked with blood. °Inability to move fingers. °White or bluish color to fingers. ° °

## 2016-05-16 NOTE — H&P (Signed)
  Gina Wood is an 55 y.o. female.   Chief Complaint: right wrist pain HPI: Gina Wood is a 55 year old right-hand-dominant female who sustained an injury to her right wrist when she was cleaning her town house for sale. The injury occurred approximately October 29. She was seen at our urgent care where x-rays were taken revealing a distal radius fracture right wrist. She was placed in a [place] splint. She is not complaining of pain until last Thursday when she began having an aching pain with a VAS score 3-4/10. She has no prior history of injury. She has no history diabetes thyroid problems arthritis or gout.She was sent for CT scan on her last visit to assess the intra-articular nature of questionable displacement of the fracture fragment.          Past Medical History:  Diagnosis Date  . Anxiety disorder   . Chest pain, unspecified   . GERD (gastroesophageal reflux disease)   . HTN (hypertension)   . IBS (irritable bowel syndrome)   . Insomnia   . Palpitations     History reviewed. No pertinent surgical history.  History reviewed. No pertinent family history. Social History:  reports that she has never smoked. She has never used smokeless tobacco. She reports that she does not drink alcohol or use drugs.  Allergies:  Allergies  Allergen Reactions  . Penicillins Hives    Medications Prior to Admission  Medication Sig Dispense Refill  . omeprazole (PRILOSEC OTC) 20 MG tablet Take 40 mg by mouth at bedtime.    . propranolol (INDERAL) 80 MG tablet TAKE 1 TABLET BY MOUTH ONCE DAILY 30 tablet 11    No results found for this or any previous visit (from the past 48 hour(s)).  No results found.   Pertinent items are noted in HPI.  Height 5\' 3"  (1.6 m), weight 79.4 kg (175 lb).  General appearance: alert, cooperative and appears stated age Head: Normocephalic, without obvious abnormality Neck: no JVD Resp: clear to auscultation bilaterally Cardio: regular rate and rhythm,  S1, S2 normal, no murmur, click, rub or gallop GI: soft, non-tender; bowel sounds normal; no masses,  no organomegaly Extremities: right wrist pain Pulses: 2+ and symmetric Skin: Skin color, texture, turgor normal. No rashes or lesions Neurologic: Grossly normal Incision/Wound: na  Assessment/Plan Assessment:  1. Other closed intra-articular fracture of distal end of right radius   Plan: Her x-rays CT scan are reviewed with her. This reveals a radial styloid fracture. She also has a chip fracture in the dorsum of her triquetrum. The distal radial styloid fracture is displaced approximately 50% of the depth of the radial styloid. This is all volar. We have discussed with her open reduction internal fixation. We will plan on volar plate for a buttress along with pins to the radial styloid. Pre-peri-and postoperative course been discussed along with risks and complications. She is aware that there is no guarantee to the surgery possible the possibility of infection recurrence injury to arteries nerves tendons the possibility of nonunion. He is aware of the potential for removal of the plate and irritation to tendons. This will be scheduled as an outpatient under regional anesthesia      Toniette Devera R 05/16/2016, 12:21 PM

## 2016-05-16 NOTE — Anesthesia Postprocedure Evaluation (Signed)
Anesthesia Post Note  Patient: Everett Graffamara Van  Procedure(s) Performed: Procedure(s) (LRB): OPEN REDUCTION INTERNAL FIXATION (ORIF) RIGHT  DISTAL RADIAL FRACTURE (Right)  Patient location during evaluation: PACU Anesthesia Type: General and Regional Level of consciousness: awake and alert Pain management: pain level controlled Vital Signs Assessment: post-procedure vital signs reviewed and stable Respiratory status: spontaneous breathing, nonlabored ventilation, respiratory function stable and patient connected to nasal cannula oxygen Cardiovascular status: blood pressure returned to baseline and stable Postop Assessment: no signs of nausea or vomiting Anesthetic complications: no    Last Vitals:  Vitals:   05/16/16 1645 05/16/16 1700  BP: 128/73 130/79  Pulse: 67 65  Resp: (!) 25 (!) 22  Temp:      Last Pain:  Vitals:   05/16/16 1700  TempSrc:   PainSc: 0-No pain                 Shelton SilvasKevin D Carreen Milius

## 2016-05-16 NOTE — Progress Notes (Signed)
Assisted Dr. Carignan with right, ultrasound guided, supraclavicular block. Side rails up, monitors on throughout procedure. See vital signs in flow sheet. Tolerated Procedure well. 

## 2016-05-16 NOTE — Anesthesia Procedure Notes (Signed)
Procedure Name: LMA Insertion Date/Time: 05/16/2016 3:20 PM Performed by: Burna CashONRAD, Helix Lafontaine C Pre-anesthesia Checklist: Patient identified, Emergency Drugs available, Suction available and Patient being monitored Patient Re-evaluated:Patient Re-evaluated prior to inductionOxygen Delivery Method: Circle system utilized Preoxygenation: Pre-oxygenation with 100% oxygen Intubation Type: IV induction Ventilation: Mask ventilation without difficulty LMA: LMA inserted LMA Size: 4.0 Number of attempts: 1 Airway Equipment and Method: Bite block Placement Confirmation: positive ETCO2 Tube secured with: Tape Dental Injury: Teeth and Oropharynx as per pre-operative assessment

## 2016-05-17 ENCOUNTER — Encounter (HOSPITAL_BASED_OUTPATIENT_CLINIC_OR_DEPARTMENT_OTHER): Payer: Self-pay | Admitting: Orthopedic Surgery

## 2016-05-17 NOTE — Op Note (Signed)
NAME:  [name], [name]                 ACCOUNT NO.:  [id number]  MEDICAL RECORD NO.:  [id number]  LOCATION:                                 FACILITY:  PHYSICIAN:  Cindee SaltGary Pinkey Mcjunkin, M.D.       DATE OF BIRTH:  10-31-1960  DATE OF PROCEDURE:  05/16/2016 DATE OF DISCHARGE:                              OPERATIVE REPORT   PREOPERATIVE DIAGNOSIS:  Intra-articular fracture, right distal radius, two-part.  POSTOPERATIVE DIAGNOSIS:  Intra-articular fracture, right distal radius, two-part.  OPERATION:  Open reduction and internal fixation radial styloid fracture right distal radius using Acumed volar distal plate.  SURGEON:  Cindee SaltGary Adela Esteban, M.D.  ASSISTANT:  Betha LoaKevin Javar Eshbach, M.D.  ANESTHESIA:  Supraclavicular block general.  ANESTHESIOLOGIST:  Hollis.  HISTORY:  The patient is a 55 year old female, who fell suffering a fracture of her right distal radius approximately 2.5 weeks ago.  She had CT scan done revealing a marked displacement of the radial styloid. She is admitted for open reduction and internal fixation.  Pre, peri, and postoperative course have been discussed along with risks and complications.  She is aware that there is no guarantee to the surgery, the possibility of infection; recurrence of injury to arteries, nerves, tendons; incomplete relief of symptoms and dystrophy.  In the preoperative area, the patient is seen, the extremity marked by both patient and surgeon.  Antibiotic was also given.  PROCEDURE IN DETAIL:  The patient was brought to the operating room, where a general anesthetic was carried out without difficulty.  A supraclavicular block had been carried out in the preoperative area by Dr. Hart RochesterHollis.  She was prepped using ChloraPrep in a supine position with the right arm free.  A 3-minute dry time was allowed.  Time-out taken, confirming the patient and procedure.  A volar incision was made for application of a volar distal radius plate.  This was carried down through  subcutaneous tissue.  Bleeders were electrocauterized with bipolar.  The dissection was carried through the flexor carpi radialis sheath protecting the neurovascular structures.  A moderate amount of scarring was present in the entire area.  With blunt and sharp dissection, this was taken down to the pronator quadratus.  The brachioradialis was then isolated and transected.  The pronator quadratus was then incised on its radial aspect.  The displaced fracture was immediately apparent.  This was debrided.  The periosteum was elevated off this.  A Hohmann retractor placed allowing visualization of the fracture.  The fracture was then reduced and a distal volar plate placed and pinned in position.  X-rays showed that this was slightly rotated, this was readjusted. This appeared to be in good position on AP and lateral x-rays.  The fracture appeared reduced.  The plate was then affixed proximally with a 12-mm standard size screw in the gliding hole. The distal screws were then placed, 2 on the radial side and these were placed as locking screws.  The remaining pegs were placed leaving the most proximal ulnar one non-loaded.  These are measured between 16 and 18 mm.  X-rays taken in AP and lateral direction revealed the fracture had not been captured with the radial screws, these  radial screws were removed.  The fracture was reduced, held in position, re-drilled, and a nonlocking screw was then placed firmly into the radial styloid in a reduced position.  The remaining screws were placed maintaining the position in both AP and lateral directions.  The oblique showed no penetration of the articular surface.  The remaining 2 proximal screws were then affixed, these were 11 and 10 mm each.  X-rays confirmed the position of plate and screws in good position with reduction of the fracture in an anatomic position.  The wound was copiously irrigated with saline.  The pronator quadratus was repaired  with figure-of-eight 4- 0 Vicryl sutures.  The subcutaneous tissue was closed with interrupted 4- 0 Vicryl, and the skin with interrupted 4-0 nylon sutures.  A sterile compressive dressing, dorsal palmar thumb spica splint was applied.  On deflation of the tourniquet, all fingers immediately pinked.  She was taken to the recovery room for observation in satisfactory condition. She will be discharged to home to [facility] [place] in 1 week, on Percocet.          ______________________________ Cindee SaltGary Swara Donze, M.D.     GK/MEDQ  D:  05/16/2016  T:  05/17/2016  Job:  [id number]

## 2016-09-05 ENCOUNTER — Telehealth: Payer: Self-pay | Admitting: Cardiovascular Disease

## 2016-09-05 MED ORDER — PROPRANOLOL HCL 80 MG PO TABS: 80.0000 mg | ORAL_TABLET | Freq: Every day | ORAL | Status: DC

## 2016-09-05 NOTE — Telephone Encounter (Signed)
°*  STAT* If patient is at the pharmacy, call can be transferred to refill team.   1. Which medications need to be refilled? (please list name of each medication and dose if known) propranolol (INDERAL) 80 MG tablet [[id number]][[id number]]   Pt is scheduled to see Dr. Kirtland BouchardK on 5/15 --she just wants to make sure she doesn't run out prior to apt

## 2016-09-05 NOTE — Telephone Encounter (Signed)
Sent in enough for 30 days. We will refill again once she is seen in office.

## 2016-11-12 ENCOUNTER — Encounter: Payer: Self-pay | Admitting: Cardiovascular Disease

## 2016-11-12 ENCOUNTER — Ambulatory Visit (INDEPENDENT_AMBULATORY_CARE_PROVIDER_SITE_OTHER): Payer: 59 | Admitting: Cardiovascular Disease

## 2016-11-12 VITALS — BP 128/78 | HR 69 | Ht 63.5 in

## 2016-11-12 DIAGNOSIS — E782 Mixed hyperlipidemia: Secondary | ICD-10-CM

## 2016-11-12 DIAGNOSIS — I10 Essential (primary) hypertension: Secondary | ICD-10-CM

## 2016-11-12 DIAGNOSIS — R002 Palpitations: Secondary | ICD-10-CM

## 2016-11-12 DIAGNOSIS — Z13220 Encounter for screening for lipoid disorders: Secondary | ICD-10-CM | POA: Diagnosis not present

## 2016-11-12 DIAGNOSIS — Z131 Encounter for screening for diabetes mellitus: Secondary | ICD-10-CM | POA: Diagnosis not present

## 2016-11-12 MED ORDER — PROPRANOLOL HCL 80 MG PO TABS: 80.0000 mg | ORAL_TABLET | Freq: Every day | ORAL | 11 refills | Status: DC

## 2016-11-12 NOTE — Patient Instructions (Signed)
Your physician wants you to follow-up in: 1 year Dr Genevieve NorlanderKoneswarn You will receive a reminder letter in the mail two months in advance. If you don't receive a letter, please call our office to schedule the follow-up appointment.    Your physician recommends that you return for lab work in: Fasting blood work   I refilled your Propranolol     If you need a refill on your cardiac medications before your next appointment, please call your pharmacy.       Thank you for choosing [facility] !

## 2016-11-12 NOTE — Progress Notes (Signed)
SUBJECTIVE: The patient presents for follow-up of palpitations and hypertension.  She injured her right wrist after a fall and had to undergo surgery. She has some mild carpal tunnel symptoms since that time.  She had some feet swelling after 3-1/2 hour car ride when she didn't stop for breaks.  She notes that her mother required a pacemaker.  ECG performed in the office today which I ordered and personally interpreted demonstrates normal sinus rhythm with no ischemic ST segment or T-wave abnormalities, nor any arrhythmias.  The patient denies any symptoms of chest pain, palpitations, shortness of breath, lightheadedness, dizziness, leg swelling, orthopnea, PND, and syncope.    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 Lives in [place].   Review of Systems: As per "subjective", otherwise negative.  Allergies  Allergen Reactions  . Penicillins Hives    Current Outpatient Prescriptions  Medication Sig Dispense Refill  . omeprazole (PRILOSEC OTC) 20 MG tablet Take 40 mg by mouth at bedtime.    . propranolol (INDERAL) 80 MG tablet Take 1 tablet (80 mg total) by mouth daily. 30 tablet 01   No current facility-administered medications for this visit.     Past Medical History:  Diagnosis Date  . Anxiety disorder   . Chest pain, unspecified   . GERD (gastroesophageal reflux disease)   . HTN (hypertension)   . IBS (irritable bowel syndrome)   . Insomnia   . Palpitations     Past Surgical History:  Procedure Laterality Date  . OPEN REDUCTION INTERNAL FIXATION (ORIF) DISTAL RADIAL FRACTURE Right 05/16/2016   Procedure: OPEN REDUCTION INTERNAL FIXATION (ORIF) RIGHT  DISTAL RADIAL FRACTURE;  Surgeon: Cindee SaltGary Kuzma, MD;  Location: [facility];  Service: Orthopedics;  Laterality: Right;    Social History     Social History  . Marital status: Single    Spouse name: N/A  . Number of children: N/A  . Years of education: N/A   Occupational History  . [employer]    Social History Main Topics  . Smoking status: Never Smoker  . Smokeless tobacco: Never Used  . Alcohol use No  . Drug use: No  . Sexual activity: Not on file   Other Topics Concern  . Not on file   Social History Narrative  . No narrative on file     Vitals:   11/12/16 0810  BP: 128/78  Pulse: 69  SpO2: 98%  Height: 5' 3.5" (1.613 m)    Wt Readings from Last 3 Encounters:  05/16/16 233 lb (105.7 kg)  02/24/13 175 lb (79.4 kg)  12/06/10 175 lb (79.4 kg)     PHYSICAL EXAM General: NAD HEENT: Normal. Neck: No JVD, no thyromegaly. Lungs: Clear to auscultation bilaterally with normal respiratory effort. CV: Nondisplaced PMI.  Regular rate and rhythm, normal S1/S2, no S3/S4, no murmur. No pretibial or periankle edema.  No carotid bruit.   Abdomen: Soft, nontender, no distention.  Neurologic: Alert and oriented.  Psych: Normal affect. Skin: Normal. Musculoskeletal: No gross deformities.    ECG: Most recent ECG reviewed.   Labs: No results found for: K, BUN, CREATININE, ALT, TSH, HGB   Lipids: No results found for: LDLCALC, LDLDIRECT, CHOL, TRIG, HDL     ASSESSMENT AND PLAN:  1. Palpitations: Symptomatically stable on propranolol 80 mg. No changes to therapy. As per  her request, I will check CBC, basic metabolic panel, lipids, and HbA1c.  2. Essential HTN: Controlled on propranolol. No changes to therapy.  3. Prevention: As per her request, I will check CBC, basic metabolic panel, lipids, and HbA1c.    Disposition: Follow up 1 year.  Prentice Docker, M.D., F.A.C.C.

## 2017-09-15 ENCOUNTER — Other Ambulatory Visit: Payer: Self-pay | Admitting: Cardiovascular Disease

## 2017-11-27 ENCOUNTER — Ambulatory Visit: Payer: 59 | Admitting: Cardiovascular Disease

## 2017-11-27 ENCOUNTER — Encounter: Payer: Self-pay | Admitting: Cardiovascular Disease

## 2017-11-27 VITALS — BP 125/79 | HR 62 | Ht 63.5 in

## 2017-11-27 DIAGNOSIS — I10 Essential (primary) hypertension: Secondary | ICD-10-CM

## 2017-11-27 DIAGNOSIS — Z13220 Encounter for screening for lipoid disorders: Secondary | ICD-10-CM

## 2017-11-27 DIAGNOSIS — R002 Palpitations: Secondary | ICD-10-CM

## 2017-11-27 DIAGNOSIS — Z131 Encounter for screening for diabetes mellitus: Secondary | ICD-10-CM

## 2017-11-27 NOTE — Progress Notes (Signed)
SUBJECTIVE: The patient presents for follow-up of palpitations and hypertension.  ECG performed in the office today which I ordered and personally interpreted demonstrates normal sinus rhythm with no ischemic ST segment or T-wave abnormalities, nor any arrhythmias.  She feels well overall.  She denies chest pain, orthopnea, dizziness, syncope, and shortness of breath.  She very seldom has palpitations.  Her biggest challenge over the past several months has been taking care of her elderly mother.  She is now living in assisted living at [facility] in [place].  She is very happy with the care her mother is receiving there.     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 Lives in [place].   Review of Systems: As per "subjective", otherwise negative.  Allergies  Allergen Reactions  . Penicillins Hives    Current Outpatient Medications  Medication Sig Dispense Refill  . omeprazole (PRILOSEC OTC) 20 MG tablet Take 40 mg by mouth at bedtime.    . propranolol (INDERAL) 80 MG tablet TAKE 1 TABLET BY MOUTH EVERY DAY 30 tablet 10   No current facility-administered medications for this visit.     Past Medical History:  Diagnosis Date  . Anxiety disorder   . Chest pain, unspecified   . GERD (gastroesophageal reflux disease)   . HTN (hypertension)   . IBS (irritable bowel syndrome)   . Insomnia   . Palpitations     Past Surgical History:  Procedure Laterality Date  . OPEN REDUCTION INTERNAL FIXATION (ORIF) DISTAL RADIAL FRACTURE Right 05/16/2016   Procedure: OPEN REDUCTION INTERNAL FIXATION (ORIF) RIGHT  DISTAL RADIAL FRACTURE;  Surgeon: Cindee Salt, MD;  Location: [facility];  Service: Orthopedics;  Laterality: Right;    Social History   Socioeconomic History  . Marital status: Single    Spouse  name: Not on file  . Number of children: Not on file  . Years of education: Not on file  . Highest education level: Not on file  Occupational History  . Occupation: [employer]  Social Needs  . Financial resource strain: Not on file  . Food insecurity:    Worry: Not on file    Inability: Not on file  . Transportation needs:    Medical: Not on file    Non-medical: Not on file  Tobacco Use  . Smoking status: Never Smoker  . Smokeless tobacco: Never Used  Substance and Sexual Activity  . Alcohol use: Yes    Alcohol/week: 4.2 oz    Types: 7 Glasses of wine per week  . Drug use: No  . Sexual activity: Not on file  Lifestyle  . Physical activity:    Days per week: Not on file    Minutes per session: Not on file  . Stress: Not on file  Relationships  . Social connections:    Talks on phone: Not on file    Gets together: Not on file    Attends religious service: Not on file    Active member of club or organization: Not on file    Attends meetings of clubs or organizations: Not on file    Relationship status: Not on file  . Intimate partner violence:    Fear of current or ex partner: Not on file    Emotionally abused: Not on file    Physically abused: Not  on file    Forced sexual activity: Not on file  Other Topics Concern  . Not on file  Social History Narrative  . Not on file     Vitals:   11/27/17 0818  BP: 125/79  Pulse: 62  SpO2: 98%  Height: 5' 3.5" (1.613 m)    Wt Readings from Last 3 Encounters:  05/16/16 233 lb (105.7 kg)  02/24/13 175 lb (79.4 kg)  12/06/10 175 lb (79.4 kg)     PHYSICAL EXAM General: NAD HEENT: Normal. Neck: No JVD, no thyromegaly. Lungs: Clear to auscultation bilaterally with normal respiratory effort. CV: Regular rate and rhythm, normal S1/S2, no S3/S4, no murmur. No pretibial or periankle edema.  No carotid bruit.   Abdomen: Soft, nontender, no distention.  Neurologic: Alert and oriented.  Psych: Normal affect. Skin:  Normal. Musculoskeletal: No gross deformities.    ECG: Most recent ECG reviewed.   Labs: No results found for: K, BUN, CREATININE, ALT, TSH, HGB   Lipids: No results found for: LDLCALC, LDLDIRECT, CHOL, TRIG, HDL     ASSESSMENT AND PLAN:  1. Palpitations: Symptomatically stable on propranolol 80 mg. No changes to therapy. As per her request, I will check CBC, comprehensive metabolic panel, lipids, TSH, and HbA1c.  2. Essential HTN: Controlled on propranolol. No changes to therapy.  3. Prevention: As per her request, I will check CBC, comprehensive metabolic panel, lipids, TSH, and HbA1c.    Disposition: Follow up 1 year   Prentice Docker, M.D., F.A.C.C.

## 2017-11-27 NOTE — Patient Instructions (Signed)
Your physician wants you to follow-up in:  1 year with Dr.Koneswaran You will receive a reminder letter in the mail two months in advance. If you don't receive a letter, please call our office to schedule the follow-up appointment.    Your physician recommends that you continue on your current medications as directed. Please refer to the Current Medication list given to you today.    If you need a refill on your cardiac medications before your next appointment, please call your pharmacy.     Get FASTING lab work     No tests ordered today      Thank you for choosing [facility] !

## 2018-01-08 ENCOUNTER — Other Ambulatory Visit (HOSPITAL_COMMUNITY)
Admission: RE | Admit: 2018-01-08 | Discharge: 2018-01-08 | Disposition: A | Discharge status: Home / Self Care | Payer: 59 | Source: Ambulatory Visit | Attending: Cardiovascular Disease | Admitting: Cardiovascular Disease

## 2018-01-08 ENCOUNTER — Encounter: Payer: Self-pay | Admitting: Cardiovascular Disease

## 2018-01-08 DIAGNOSIS — I10 Essential (primary) hypertension: Secondary | ICD-10-CM | POA: Insufficient documentation

## 2018-01-08 LAB — COMPREHENSIVE METABOLIC PANEL
ALT: 22 U/L (ref 0–44)
AST: 19 U/L (ref 15–41)
Albumin: 4 g/dL (ref 3.5–5.0)
Alkaline Phosphatase: 87 U/L (ref 38–126)
Anion gap: 9 (ref 5–15)
BUN: 22 mg/dL — ABNORMAL HIGH (ref 6–20)
CO2: 21 mmol/L — ABNORMAL LOW (ref 22–32)
Calcium: 9.1 mg/dL (ref 8.9–10.3)
Chloride: 110 mmol/L (ref 98–111)
Creatinine, Ser: 0.76 mg/dL (ref 0.44–1.00)
GFR calc Af Amer: >60 mL/min (ref >60)
GFR calc non Af Amer: >60 mL/min (ref >60)
Glucose, Bld: 135 mg/dL — ABNORMAL HIGH (ref 70–99)
Potassium: 4.5 mmol/L (ref 3.5–5.1)
Sodium: 140 mmol/L (ref 135–145)
Total Bilirubin: 0.6 mg/dL (ref 0.3–1.2)
Total Protein: 7.7 g/dL (ref 6.5–8.1)

## 2018-01-08 LAB — CBC
HCT: 46.5 % — ABNORMAL HIGH (ref 36.0–46.0)
Hemoglobin: 15.5 g/dL — ABNORMAL HIGH (ref 12.0–15.0)
MCH: 30.3 pg (ref 26.0–34.0)
MCHC: 33.3 g/dL (ref 30.0–36.0)
MCV: 91 fL (ref 78.0–100.0)
Platelets: 290 10*3/uL (ref 150–400)
RBC: 5.11 MIL/uL (ref 3.87–5.11)
RDW: 14.2 % (ref 11.5–15.5)
WBC: 7.8 10*3/uL (ref 4.0–10.5)

## 2018-01-08 LAB — LIPID PANEL
Cholesterol: 212 mg/dL — ABNORMAL HIGH (ref 0–200)
HDL: 53 mg/dL (ref >40)
LDL Cholesterol: 135 mg/dL — ABNORMAL HIGH (ref 0–99)
Total CHOL/HDL Ratio: 4 RATIO
Triglycerides: 122 mg/dL (ref <150)
VLDL: 24 mg/dL (ref 0–40)

## 2018-01-08 LAB — HEMOGLOBIN A1C
Hgb A1c MFr Bld: 5.8 % — ABNORMAL HIGH (ref 4.8–5.6)
Mean Plasma Glucose: 119.76 mg/dL

## 2018-01-08 LAB — TSH: TSH: 2.18 u[IU]/mL (ref 0.350–4.500)

## 2018-01-23 ENCOUNTER — Other Ambulatory Visit (HOSPITAL_COMMUNITY)
Admission: RE | Admit: 2018-01-23 | Discharge: 2018-01-23 | Disposition: A | Discharge status: Home / Self Care | Payer: 59 | Source: Other Acute Inpatient Hospital | Attending: Urology | Admitting: Urology

## 2018-01-23 ENCOUNTER — Ambulatory Visit: Payer: 59 | Admitting: Urology

## 2018-01-23 DIAGNOSIS — N209 Urinary calculus, unspecified: Secondary | ICD-10-CM | POA: Diagnosis not present

## 2018-01-23 LAB — URINALYSIS, COMPLETE (UACMP) WITH MICROSCOPIC
Bacteria, UA: NONE SEEN
Bilirubin Urine: NEGATIVE
Glucose, UA: NEGATIVE mg/dL
Hgb urine dipstick: NEGATIVE
Ketones, ur: NEGATIVE mg/dL
Nitrite: NEGATIVE
Protein, ur: NEGATIVE mg/dL
Specific Gravity, Urine: 1.012 (ref 1.005–1.030)
pH: 5 (ref 5.0–8.0)

## 2018-01-24 LAB — URINE CULTURE

## 2018-02-02 ENCOUNTER — Other Ambulatory Visit: Payer: Self-pay | Admitting: Urology

## 2018-02-02 DIAGNOSIS — N200 Calculus of kidney: Secondary | ICD-10-CM

## 2018-02-03 ENCOUNTER — Other Ambulatory Visit: Payer: Self-pay | Admitting: Urology

## 2018-02-03 DIAGNOSIS — N200 Calculus of kidney: Secondary | ICD-10-CM

## 2018-02-11 ENCOUNTER — Ambulatory Visit (HOSPITAL_COMMUNITY)
Admission: RE | Admit: 2018-02-11 | Discharge: 2018-02-11 | Disposition: A | Discharge status: Home / Self Care | Payer: 59 | Source: Ambulatory Visit | Attending: Urology | Admitting: Urology

## 2018-02-11 DIAGNOSIS — K8020 Calculus of gallbladder without cholecystitis without obstruction: Secondary | ICD-10-CM | POA: Insufficient documentation

## 2018-02-11 DIAGNOSIS — N200 Calculus of kidney: Secondary | ICD-10-CM | POA: Diagnosis not present

## 2018-08-28 ENCOUNTER — Other Ambulatory Visit: Payer: Self-pay

## 2018-08-28 MED ORDER — PROPRANOLOL HCL 80 MG PO TABS: 80.0000 mg | ORAL_TABLET | Freq: Every day | ORAL | 1 refills | Status: DC

## 2018-08-28 NOTE — Telephone Encounter (Signed)
[facility]

## 2018-11-03 ENCOUNTER — Other Ambulatory Visit: Payer: Self-pay | Admitting: Cardiovascular Disease

## 2018-11-03 MED ORDER — PROPRANOLOL HCL 80 MG PO TABS: 80.0000 mg | ORAL_TABLET | Freq: Every day | ORAL | 1 refills | Status: DC

## 2018-11-03 NOTE — Telephone Encounter (Signed)
° °  1. Which medications need to be refilled? (please list name of each medication and dose if known) propranolol (INDERAL) 80 MG    2. Which pharmacy/location (including street and city if local pharmacy) is medication to be sent to?  [facility] [place] [place]   3. Do they need a 30 day or 90 day supply?   90

## 2018-11-03 NOTE — Telephone Encounter (Signed)
Refilled inderal as requested

## 2018-12-02 ENCOUNTER — Telehealth: Payer: Self-pay | Admitting: Cardiovascular Disease

## 2018-12-02 NOTE — Telephone Encounter (Signed)
Virtual Visit Pre-Appointment Phone Call  "(Name), I am calling you today to discuss your upcoming appointment. We are currently trying to limit exposure to the virus that causes COVID-19 by seeing patients at home rather than in the office."  1. "What is the BEST phone number to call the day of the visit?" - include this in appointment notes  2. Do you have or have access to (through a family member/friend) a smartphone with video capability that we can use for your visit?" a. If yes - list this number in appt notes as cell (if different from BEST phone #) and list the appointment type as a VIDEO visit in appointment notes b. If no - list the appointment type as a PHONE visit in appointment notes  Confirm consent - "In the setting of the current Covid19 crisis, you are scheduled for a (phone or video) visit with your provider on (date) at (time).  Just as we do with many in-office visits, in order for you to participate in this visit, we must obtain consent.  If you'd like, I can send this to your mychart (if signed up) or email for you to review.  Otherwise, I can obtain your verbal consent now.  All virtual visits are billed to your insurance company just like a normal visit would be.  By agreeing to a virtual visit, we'd like you to understand that the technology does not allow for your provider to perform an examination, and thus may limit your provider's ability to fully assess your condition. If your provider identifies any concerns that need to be evaluated in person, we will make arrangements to do so.  Finally, though the technology is pretty good, we cannot assure that it will always work on either your or our end, and in the setting of a video visit, we may have to convert it to a phone-only visit.  In either situation, we cannot ensure that we have a secure connection.  Are you willing to proceed?" STAFF: Did the patient verbally acknowledge consent to telehealth visit? Document  YES/NO here: yes  3. Advise patient to be prepared - "Two hours prior to your appointment, go ahead and check your blood pressure, pulse, oxygen saturation, and your weight (if you have the equipment to check those) and write them all down. When your visit starts, your provider will ask you for this information. If you have an Apple Watch or Kardia device, please plan to have heart rate information ready on the day of your appointment. Please have a pen and paper handy nearby the day of the visit as well."  4. Give patient instructions for MyChart download to smartphone OR [email or web address] as below if video visit (depending on what platform provider is using)  5. Inform patient they will receive a phone call 15 minutes prior to their appointment time (may be from unknown caller ID) so they should be prepared to answer    TELEPHONE CALL NOTE  Gina Wood has been deemed a candidate for a follow-up tele-health visit to limit community exposure during the Covid-19 pandemic. I spoke with the patient via phone to ensure availability of phone/video source, confirm preferred email & phone number, and discuss instructions and expectations.  I reminded Gina Graffamara Gartland to be prepared with any vital sign and/or heart rhythm information that could potentially be obtained via home monitoring, at the time of her visit. I reminded Gina Graffamara Costanzo to expect a phone call prior to her visit.  Aurther Lofterry  L Goins 12/02/2018 2:48 PM

## 2018-12-07 ENCOUNTER — Ambulatory Visit: Payer: Self-pay | Admitting: Cardiovascular Disease

## 2018-12-08 ENCOUNTER — Encounter: Payer: Self-pay | Admitting: Cardiovascular Disease

## 2018-12-08 ENCOUNTER — Telehealth (INDEPENDENT_AMBULATORY_CARE_PROVIDER_SITE_OTHER): Payer: Managed Care, Other (non HMO) | Admitting: Cardiovascular Disease

## 2018-12-08 ENCOUNTER — Other Ambulatory Visit: Payer: Self-pay

## 2018-12-08 VITALS — Ht 63.5 in

## 2018-12-08 DIAGNOSIS — R002 Palpitations: Secondary | ICD-10-CM

## 2018-12-08 DIAGNOSIS — E7800 Pure hypercholesterolemia, unspecified: Secondary | ICD-10-CM

## 2018-12-08 DIAGNOSIS — R7303 Prediabetes: Secondary | ICD-10-CM

## 2018-12-08 DIAGNOSIS — I10 Essential (primary) hypertension: Secondary | ICD-10-CM

## 2018-12-08 NOTE — Progress Notes (Signed)
Virtual Visit via Telephone Note   This visit type was conducted due to national recommendations for restrictions regarding the COVID-19 Pandemic (e.g. social distancing) in an effort to limit this patient's exposure and mitigate transmission in our community.  Due to her co-morbid illnesses, this patient is at least at moderate risk for complications without adequate follow up.  This format is felt to be most appropriate for this patient at this time.  The patient did not have access to video technology/had technical difficulties with video requiring transitioning to audio format only (telephone).  All issues noted in this document were discussed and addressed.  No physical exam could be performed with this format.  Please refer to the patient's chart for her  consent to telehealth for [facility].   Date:  12/08/2018   ID:  Gina Wood, DOB [birth year], MRN [id number]  Patient Location: Home Provider Location: Home  PCP:  Patient, No Pcp Per  Cardiologist:  Kate Sable, MD  Electrophysiologist:  None   Evaluation Performed:  Follow-Up Visit  Chief Complaint:  palpitations  History of Present Illness:    Gina Wood is a 58 y.o. female with palpitations and hypertension.  She was furloughed in mid-April. She is hoping to return to work in July.  Her mother is in assisted living at [facility] in [place].  She's had some insomnia. She's been very careful to wear a mask when going out.  The patient denies any symptoms of chest pain, palpitations, shortness of breath, lightheadedness, dizziness, leg swelling, orthopnea, PND, and syncope.  She does have restless leg syndrome (x 10-20 yrs). She notices this when driving to her house in [place] which is about a 2 hour drive. Applying heat to her legs helps.  The patient does not have symptoms concerning for COVID-19 infection (fever, chills, cough, or new shortness of breath).   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 Lives in [place].  Past Medical History:  Diagnosis Date  . Anxiety disorder   . Chest pain, unspecified   . GERD (gastroesophageal reflux disease)   . HTN (hypertension)   . IBS (irritable bowel syndrome)   . Insomnia   . Palpitations    Past Surgical History:  Procedure Laterality Date  . OPEN REDUCTION INTERNAL FIXATION (ORIF) DISTAL RADIAL FRACTURE Right 05/16/2016   Procedure: OPEN REDUCTION INTERNAL FIXATION (ORIF) RIGHT  DISTAL RADIAL FRACTURE;  Surgeon: Daryll Brod, MD;  Location: [facility];  Service: Orthopedics;  Laterality: Right;     Current Meds  Medication Sig  . omeprazole (PRILOSEC OTC) 20 MG tablet Take 40 mg by mouth at bedtime.  . propranolol (INDERAL) 80 MG tablet Take 1 tablet (80 mg total) by mouth daily.     Allergies:   Penicillins   Social History   Tobacco Use  . Smoking status: Never Smoker  . Smokeless tobacco: Never Used  Substance Use Topics  . Alcohol use: Yes    Alcohol/week: 7.0 standard drinks    Types: 7 Glasses of wine per week  . Drug use: No     Family Hx: The patient's family history includes Hypertension in her father; Stroke in her maternal grandfather.  ROS:   Please see the history of present illness.     All other systems reviewed and are negative.   Prior CV studies:  The following studies were reviewed today:  NA  Labs/Other Tests and Data Reviewed:    EKG:  No ECG reviewed.  Recent Labs: 01/08/2018: ALT 22; BUN 22; Creatinine, Ser 0.76; Hemoglobin 15.5; Platelets 290; Potassium 4.5; Sodium 140; TSH 2.180   Recent Lipid Panel Lab Results  Component Value Date/Time   CHOL 212 (H) 01/08/2018 08:03 AM   TRIG 122 01/08/2018 08:03 AM   HDL 53 01/08/2018 08:03 AM   CHOLHDL 4.0 01/08/2018 08:03 AM   LDLCALC 135 (H) 01/08/2018 08:03 AM     Wt Readings from Last 3 Encounters:  05/16/16 233 lb (105.7 kg)  02/24/13 175 lb (79.4 kg)  12/06/10 175 lb (79.4 kg)     Objective:    Vital Signs:  Ht 5' 3.5" (1.613 m)   BMI 40.63 kg/m    VITAL SIGNS:  reviewed  ASSESSMENT & PLAN:    1. Palpitations:Symptomatically stable on propranolol 80 mg. No changes to therapy. As per her request, I will check CBC, comprehensive metabolic panel, lipids, TSH, and HbA1c.  2. Essential HTN:She does not have a BP cuff. Takes propranolol. No changes to therapy.  3. Prevention:As per her request, I will check CBC, comprehensive metabolic panel, lipids, TSH, and HbA1c.  4. Hypercholesterolemia: TC 212, LDL 135 on 01/08/18. I will repeat. She needs dietary and exercise modification.  5. Pre-diabetes: A1C 5.8% on 01/08/18. I will repeat.    COVID-19 Education: The signs and symptoms of COVID-19 were discussed with the patient and how to seek care for testing (follow up with PCP or arrange E-visit).  The importance of social distancing was discussed today.  Time:   Today, I have spent 15 minutes with the patient with telehealth technology discussing the above problems.     Medication Adjustments/Labs and Tests Ordered: Current medicines are reviewed at length with the patient today.  Concerns regarding medicines are outlined above.   Tests Ordered: No orders of the defined types were placed in this encounter.   Medication Changes: No orders of the defined types were placed in this encounter.   Disposition:  Follow up in 1 year(s)  Signed, Prentice DockerSuresh Nesta Scaturro, MD  12/08/2018 9:38 AM    [facility]

## 2018-12-08 NOTE — Addendum Note (Signed)
Addended by: Debbora Lacrosse R on: 12/08/2018 09:57 AM   Modules accepted: Orders

## 2018-12-08 NOTE — Progress Notes (Signed)
Medication Instructions:  Your physician recommends that you continue on your current medications as directed. Please refer to the Current Medication list given to you today.   Labwork: Cbc cmet tsh a1c Lipid    Testing/Procedures: none  Follow-Up: Your physician wants you to follow-up in: 1 year . You will receive a reminder letter in the mail two months in advance. If you don't receive a letter, please call our office to schedule the follow-up appointment.   Any Other Special Instructions Will Be Listed Below (If Applicable).     If you need a refill on your cardiac medications before your next appointment, please call your pharmacy.

## 2019-08-13 ENCOUNTER — Other Ambulatory Visit: Payer: Self-pay | Admitting: Cardiovascular Disease

## 2019-10-13 ENCOUNTER — Encounter: Payer: Self-pay | Admitting: Obstetrics and Gynecology

## 2019-10-13 ENCOUNTER — Ambulatory Visit (INDEPENDENT_AMBULATORY_CARE_PROVIDER_SITE_OTHER): Payer: Managed Care, Other (non HMO) | Admitting: Obstetrics and Gynecology

## 2019-10-13 ENCOUNTER — Other Ambulatory Visit: Payer: Self-pay

## 2019-10-13 VITALS — BP 124/80 | Ht 63.5 in

## 2019-10-13 DIAGNOSIS — Z01419 Encounter for gynecological examination (general) (routine) without abnormal findings: Secondary | ICD-10-CM

## 2019-10-13 DIAGNOSIS — Z124 Encounter for screening for malignant neoplasm of cervix: Secondary | ICD-10-CM | POA: Diagnosis not present

## 2019-10-13 DIAGNOSIS — Z1151 Encounter for screening for human papillomavirus (HPV): Secondary | ICD-10-CM

## 2019-10-13 NOTE — Progress Notes (Signed)
Gina Wood 03/25/1961 [id number]  SUBJECTIVE:  59 y.o. G0P0000 female new patient here for an annual routine gynecologic exam and Pap smear. She has no gynecologic concerns. She has been helping to care for her mother who resides in a care facility nearby her home in the last several years so she has not been able to seek care as often as she would like for herself.    Current Outpatient Medications  Medication Sig Dispense Refill  . omeprazole (PRILOSEC OTC) 20 MG tablet Take 40 mg by mouth at bedtime.    . propranolol (INDERAL) 80 MG tablet TAKE 1 TABLET BY MOUTH EVERY DAY 90 tablet 1   No current facility-administered medications for this visit.   Allergies: Penicillins  No LMP recorded. Patient is postmenopausal.  Past medical history,surgical history, problem list, medications, allergies, family history and social history were all reviewed and documented as reviewed in the EPIC chart.  ROS:  Feeling well. No dyspnea or chest pain on exertion.  No abdominal pain, change in bowel habits, black or bloody stools. +constipation.  No urinary tract symptoms. GYN ROS: no abnormal bleeding, pelvic pain or discharge, no breast pain or new or enlarging lumps on self exam. No neurological complaints.   OBJECTIVE:  BP 124/80 (Cuff Size: Large)   Ht 5' 3.5" (1.613 m)   BMI 40.63 kg/m  The patient appears well, alert, oriented x 3, in no distress. ENT normal.  Neck supple. No cervical or supraclavicular adenopathy or thyromegaly.  Lungs are clear, good air entry, no wheezes, rhonchi or rales. S1 and S2 normal, no murmurs, regular rate and rhythm.  Abdomen soft without tenderness, guarding, mass or organomegaly.  Neurological is normal, no focal findings.  BREAST EXAM: breasts appear normal, no suspicious masses, no skin or nipple changes or axillary nodes  PELVIC EXAM: VULVA: normal appearing vulva with no masses, tenderness or lesions, trophic changes noted, VAGINA: normal appearing vagina  with normal color and discharge, no lesions, CERVIX: normal appearing cervix without discharge or lesions, UTERUS: Exam limited by body habitus, but uterus does not feel overly enlarged, nontender, ADNEXA: Exam limited by body habitus, adnexal region is nontender and no masses notable, PAP: Pap smear done today, thin-prep method  Chaperone: Caryn Bee present during the examination  ASSESSMENT:  59 y.o. G0P0000 here for annual gynecologic exam  PLAN:   1.  Postmenopausal.  No hormonal concerns, no hot flashes or night sweats.  No vaginal bleeding.  Mother with history of uterine cancer at age 60, still living and was managed with hysterectomy. 2.  History of uterine fibroids.  Patient indicates that she had had fibroids identified on imaging a long time ago.  We reviewed signs and symptoms to watch out for and to notify us if she is experiencing any increasing abdominal pressure/pain or vaginal bleeding. 3. Pap smear approximately 2015.  Pap smear and HPV testing is collected today. 4. Mammogram overdue.  Normal breast exam today. She is reminded to schedule an annual mammogram this year as soon as possible. 5. Colonoscopy is recommended for routine colon cancer screening.  I recommend that she contact one of the local GI groups in town to arrange this as soon as possible. 6.  Bone health.  She indicates a history of a wrist fracture after a minor trauma incident.  DEXA scan recommended given history of fracture, certainly a baseline study would be recommended by age 55.  We discussed risk factors for low bone mass and that a  routine DEXA is recommended.  We reviewed dietary and supplement sources of calcium and vitamin D and the recommended daily amounts of each to help support bone density health.  She will consider the DEXA scan. 7. Health maintenance.  No labs today as she says she will be planning to see a cardiologist that will be checking routine labs for her in the upcoming weeks.  I did  recommend that she also request a vitamin D level to be drawn.  Return annually or sooner, prn.  Theresia Majors MD, FACOG  10/13/19

## 2019-10-13 NOTE — Addendum Note (Signed)
Addended by: Dayna Barker on: 10/13/2019 09:32 AM   Modules accepted: Orders

## 2019-10-13 NOTE — Patient Instructions (Addendum)
Please remember to schedule a routine annual mammogram (contact [facility] or [facility]) and a colonoscopy for routine colon cancer screening (call one of the local GI groups in town) as soon as you can.    Given the history of fracture, consider checking a DEXA/bone density scan which can be done in our office so we can identify if you are losing bone mass.  Continue weight bearing exercise, and vitamin D (1000-2000 IU daily) and calcium intake (1200 mg daily from dietary and supplement sources) to help maintain bone strength.  Checking a vitamin D level with routine lab work is recommended.

## 2019-10-14 LAB — PAP IG AND HPV HIGH-RISK: HPV DNA High Risk: NOT DETECTED

## 2019-10-15 ENCOUNTER — Encounter: Payer: Self-pay | Admitting: Obstetrics and Gynecology

## 2019-10-15 DIAGNOSIS — Z1329 Encounter for screening for other suspected endocrine disorder: Secondary | ICD-10-CM

## 2019-10-15 DIAGNOSIS — Z13220 Encounter for screening for lipoid disorders: Secondary | ICD-10-CM

## 2019-10-15 DIAGNOSIS — Z01419 Encounter for gynecological examination (general) (routine) without abnormal findings: Secondary | ICD-10-CM

## 2019-10-18 NOTE — Telephone Encounter (Signed)
Yes she can have a TSH, CMP, CBC, lipid panel. Thank you.

## 2019-10-27 ENCOUNTER — Other Ambulatory Visit: Payer: Managed Care, Other (non HMO)

## 2019-10-27 ENCOUNTER — Other Ambulatory Visit: Payer: Self-pay

## 2019-10-27 DIAGNOSIS — Z13220 Encounter for screening for lipoid disorders: Secondary | ICD-10-CM

## 2019-10-27 DIAGNOSIS — Z01419 Encounter for gynecological examination (general) (routine) without abnormal findings: Secondary | ICD-10-CM

## 2019-10-27 DIAGNOSIS — Z1329 Encounter for screening for other suspected endocrine disorder: Secondary | ICD-10-CM

## 2019-10-28 ENCOUNTER — Encounter: Payer: Self-pay | Admitting: Obstetrics and Gynecology

## 2019-10-28 NOTE — Telephone Encounter (Signed)
Sure

## 2019-10-28 NOTE — Telephone Encounter (Signed)
Dr. Penni Bombard, Okay to have the lab add the hgbA1c to current specimen?

## 2019-10-29 LAB — CBC
HCT: 43 % (ref 35.0–45.0)
Hemoglobin: 14.3 g/dL (ref 11.7–15.5)
MCH: 30.6 pg (ref 27.0–33.0)
MCHC: 33.3 g/dL (ref 32.0–36.0)
MCV: 92.1 fL (ref 80.0–100.0)
MPV: 11.3 fL (ref 7.5–12.5)
Platelets: 291 10*3/uL (ref 140–400)
RBC: 4.67 10*6/uL (ref 3.80–5.10)
RDW: 12.7 % (ref 11.0–15.0)
WBC: 7.8 10*3/uL (ref 3.8–10.8)

## 2019-10-29 LAB — COMPREHENSIVE METABOLIC PANEL
AG Ratio: 1.5 (calc) (ref 1.0–2.5)
ALT: 17 U/L (ref 6–29)
AST: 14 U/L (ref 10–35)
Albumin: 4.2 g/dL (ref 3.6–5.1)
Alkaline phosphatase (APISO): 83 U/L (ref 37–153)
BUN: 22 mg/dL (ref 7–25)
CO2: 24 mmol/L (ref 20–32)
Calcium: 9.3 mg/dL (ref 8.6–10.4)
Chloride: 105 mmol/L (ref 98–110)
Creat: 0.73 mg/dL (ref 0.50–1.05)
Globulin: 2.8 g/dL (calc) (ref 1.9–3.7)
Glucose, Bld: 112 mg/dL — ABNORMAL HIGH (ref 65–99)
Potassium: 5 mmol/L (ref 3.5–5.3)
Sodium: 139 mmol/L (ref 135–146)
Total Bilirubin: 0.2 mg/dL (ref 0.2–1.2)
Total Protein: 7 g/dL (ref 6.1–8.1)

## 2019-10-29 LAB — TSH: TSH: 1.63 mIU/L (ref 0.40–4.50)

## 2019-10-29 LAB — HEMOGLOBIN A1C W/OUT EAG: Hgb A1c MFr Bld: 5.6 % of total Hgb (ref <5.7)

## 2019-10-29 LAB — LIPID PANEL
Cholesterol: 221 mg/dL — ABNORMAL HIGH (ref <200)
HDL: 59 mg/dL (ref >=50)
LDL Cholesterol (Calc): 140 mg/dL (calc) — ABNORMAL HIGH
Non-HDL Cholesterol (Calc): 162 mg/dL (calc) — ABNORMAL HIGH (ref <130)
Total CHOL/HDL Ratio: 3.7 (calc) (ref <5.0)
Triglycerides: 104 mg/dL (ref <150)

## 2019-11-17 ENCOUNTER — Other Ambulatory Visit: Payer: Self-pay | Admitting: Cardiovascular Disease

## 2019-11-23 ENCOUNTER — Encounter: Payer: Self-pay | Admitting: Obstetrics and Gynecology

## 2019-11-23 MED ORDER — HYDROCORTISONE 2.5 % EX CREA: TOPICAL_CREAM | Freq: Two times a day (BID) | CUTANEOUS | 0 refills | Status: DC

## 2019-11-23 NOTE — Telephone Encounter (Signed)
Rx for jar of 2.5% hydrocortisone cream is sent into her pharmacy

## 2020-01-13 ENCOUNTER — Ambulatory Visit: Payer: Managed Care, Other (non HMO) | Admitting: Cardiovascular Disease

## 2020-05-05 ENCOUNTER — Encounter: Payer: Self-pay | Admitting: Student

## 2020-05-05 ENCOUNTER — Telehealth (INDEPENDENT_AMBULATORY_CARE_PROVIDER_SITE_OTHER): Payer: Managed Care, Other (non HMO) | Admitting: Student

## 2020-05-05 DIAGNOSIS — E7800 Pure hypercholesterolemia, unspecified: Secondary | ICD-10-CM | POA: Diagnosis not present

## 2020-05-05 DIAGNOSIS — R002 Palpitations: Secondary | ICD-10-CM

## 2020-05-05 DIAGNOSIS — I10 Essential (primary) hypertension: Secondary | ICD-10-CM | POA: Diagnosis not present

## 2020-05-05 MED ORDER — PROPRANOLOL HCL 80 MG PO TABS: 80.0000 mg | ORAL_TABLET | Freq: Every day | ORAL | 3 refills | Status: DC

## 2020-05-05 NOTE — Progress Notes (Signed)
Virtual Visit via Telephone Note   This visit type was conducted due to national recommendations for restrictions regarding the COVID-19 Pandemic (e.g. social distancing) in an effort to limit this patient's exposure and mitigate transmission in our community.  Due to her co-morbid illnesses, this patient is at least at moderate risk for complications without adequate follow up.  This format is felt to be most appropriate for this patient at this time.  The patient did not have access to video technology/had technical difficulties with video requiring transitioning to audio format only (telephone).  All issues noted in this document were discussed and addressed.  No physical exam could be performed with this format.  Please refer to the patient's chart for her  consent to telehealth for Gina Wood.    Date:  05/05/2020   ID:  Gina Wood, DOB 12/03/1960, MRN [id number] The patient was identified using 2 identifiers.  Patient Location: Other: Work Dispensing optician: Office/Clinic  PCP:  Patient, No Pcp Per  Cardiologist: Previously Dr. Purvis Sheffield --> Will need to switch to new MD at next visit  Electrophysiologist:  None   Evaluation Performed:  Follow-Up Visit  Chief Complaint: Routine Visit; No concerns  History of Present Illness:    Gina Wood is a 59 y.o. female with past medical history of palpitations, HTN and GERD who presents for a follow-up telehealth visit.  She most recently had a phone visit with Dr. Purvis Sheffield in 11/2018 and denied any recent chest pain or palpitations at that time. She was struggling with restless leg syndrome. She was continued on Propranolol 80 mg daily and no changes were made to her medication regimen.   In talking with the patient today, she reports overall doing well from a cardiac perspective since her last visit. She says her palpitations have significantly improved over the years and she noticed this after going through menopause. She reports  occasional palpitations if stressed but denies any frequent episodes. No recent chest pain, progressive dyspnea on exertion, orthopnea, PND or lower extremity edema.  She remains on short-acting Propranolol as she reports being intolerant to extended release dosing in the past. She does have an old Rx for Propanolol 10 mg which she use to take for breakthrough episodes but has not had to utilize this in several years.  The patient does not have symptoms concerning for COVID-19 infection (fever, chills, cough, or new shortness of breath).    Past Medical History:  Diagnosis Date  . Anxiety disorder   . Chest pain, unspecified   . GERD (gastroesophageal reflux disease)   . HTN (hypertension)   . IBS (irritable bowel syndrome)   . Insomnia   . Palpitations    Past Surgical History:  Procedure Laterality Date  . OPEN REDUCTION INTERNAL FIXATION (ORIF) DISTAL RADIAL FRACTURE Right 05/16/2016   Procedure: OPEN REDUCTION INTERNAL FIXATION (ORIF) RIGHT  DISTAL RADIAL FRACTURE;  Surgeon: Cindee Salt, MD;  [facility];  Service: Orthopedics;  Laterality: Right;     Current Meds  Medication Sig  . omeprazole (PRILOSEC OTC) 20 MG tablet Take 40 mg by mouth at bedtime.  . propranolol (INDERAL) 80 MG tablet Take 1 tablet (80 mg total) by mouth daily.  . [DISCONTINUED] propranolol (INDERAL) 80 MG tablet TAKE 1 TABLET BY MOUTH EVERY DAY     Allergies:   Penicillins   Social History   Tobacco Use  . Smoking status: Never Smoker  . Smokeless tobacco: Never Used  Vaping Use  .  Vaping Use: Never used  Substance Use Topics  . Alcohol use: Yes    Alcohol/week: 14.0 standard drinks    Types: 14 Glasses of wine per week  . Drug use: No     Family Hx: The patient's family history includes Hypertension in her father; Stroke in her maternal grandfather; Uterine cancer in her mother.  ROS:   Please see the history of present illness.     All other systems reviewed and  are negative.   Prior CV studies:   The following studies were reviewed today:  Renal US: 01/2018 IMPRESSION: 1. The gallbladder wall is somewhat ill-defined. Irregularity in this area is questioned. There may be a degree of cystitis.  2.  Kidneys appear normal bilaterally.  3.  Cholelithiasis present.  Labs/Other Tests and Data Reviewed:    EKG:  An ECG dated 11/27/2017 was personally reviewed today and demonstrated: NSR, HR 61 with no acute ST abnormalities.   Recent Labs: 10/27/2019: ALT 17; BUN 22; Creat 0.73; Hemoglobin 14.3; Platelets 291; Potassium 5.0; Sodium 139; TSH 1.63   Recent Lipid Panel Lab Results  Component Value Date/Time   CHOL 221 (H) 10/27/2019 08:59 AM   TRIG 104 10/27/2019 08:59 AM   HDL 59 10/27/2019 08:59 AM   CHOLHDL 3.7 10/27/2019 08:59 AM   LDLCALC 140 (H) 10/27/2019 08:59 AM    Wt Readings from Last 3 Encounters:  05/16/16 233 lb (105.7 kg)  02/24/13 175 lb (79.4 kg)  12/06/10 175 lb (79.4 kg)     Objective:    Vital Signs:  There were no vitals taken for this visit.   General: Pleasant female sounding in NAD Psych: Normal affect. Neuro: Alert and oriented X 3.  Lungs:  Resp regular and unlabored while talking on the phone.   ASSESSMENT & PLAN:    1. Palpitations - She denies any recent palpitations and reports her symptoms have overall been well controlled. Lab work earlier this year showed her electrolytes and TSH were within a normal range. Continue Propranolol 80 mg daily as she reports she was previously intolerant to extended release dosing.  2. HTN - She does not check this routinely but says it was well-controlled at 120/70 on her most recent check.  Continue current medication regimen with Propanolol.  3. HLD - FLP in 09/2019 showed total cholesterol was elevated to 221 with HDL at 59, triglycerides 104 and LDL 140. She reviewed this with her Gynecologist at that time and has wished to focus on dietary changes  currently.   COVID-19 Education: The signs and symptoms of COVID-19 were discussed with the patient and how to seek care for testing (follow up with PCP or arrange E-visit). The importance of social distancing was discussed today.  Time:   Today, I have spent 11 minutes with the patient with telehealth technology discussing the above problems.     Medication Adjustments/Labs and Tests Ordered: Current medicines are reviewed at length with the patient today.  Concerns regarding medicines are outlined above.   Tests Ordered: No orders of the defined types were placed in this encounter.   Medication Changes: Meds ordered this encounter  Medications  . propranolol (INDERAL) 80 MG tablet    Sig: Take 1 tablet (80 mg total) by mouth daily.    Dispense:  90 tablet    Refill:  3    Order Specific Question:   Supervising Provider    Answer:   Lars Masson [[id number]]    Follow Up:  In Person in 1 year(s)  Signed, Ellsworth Lennox, PA-C  05/05/2020 2:19 PM    [facility]

## 2020-05-05 NOTE — Patient Instructions (Signed)

## 2020-08-14 ENCOUNTER — Other Ambulatory Visit: Payer: Self-pay

## 2020-08-14 ENCOUNTER — Ambulatory Visit (INDEPENDENT_AMBULATORY_CARE_PROVIDER_SITE_OTHER): Payer: 59 | Admitting: Cardiovascular Disease

## 2020-08-14 ENCOUNTER — Encounter: Payer: Self-pay | Admitting: Cardiovascular Disease

## 2020-08-14 VITALS — BP 142/70 | HR 64 | Ht 63.5 in

## 2020-08-14 DIAGNOSIS — I10 Essential (primary) hypertension: Secondary | ICD-10-CM | POA: Diagnosis not present

## 2020-08-14 DIAGNOSIS — E785 Hyperlipidemia, unspecified: Secondary | ICD-10-CM

## 2020-08-14 DIAGNOSIS — Z8249 Family history of ischemic heart disease and other diseases of the circulatory system: Secondary | ICD-10-CM

## 2020-08-14 DIAGNOSIS — R002 Palpitations: Secondary | ICD-10-CM

## 2020-08-14 NOTE — Patient Instructions (Signed)
Medication Instructions:  Your physician recommends that you continue on your current medications as directed. Please refer to the Current Medication list given to you today.  *If you need a refill on your cardiac medications before your next appointment, please call your pharmacy*   Lab Work: NONE   If you have labs (blood work) drawn today and your tests are completely normal, you will receive your results only by: Marland Kitchen MyChart Message (if you have MyChart) OR . A paper copy in the mail If you have any lab test that is abnormal or we need to change your treatment, we will call you to review the results.   Testing/Procedures: Your physician has requested that you have an echocardiogram. Echocardiography is a painless test that uses sound waves to create images of your heart. It provides your doctor with information about the size and shape of your heart and how well your heart's chambers and valves are working. This procedure takes approximately one hour. There are no restrictions for this procedure.  CT Calcium score     Follow-Up: At [facility], you and your health needs are our priority.  As part of our continuing mission to provide you with exceptional heart care, we have created designated Provider Care Teams.  These Care Teams include your primary Cardiologist (physician) and Advanced Practice Providers (APPs -  Physician Assistants and Nurse Practitioners) who all work together to provide you with the care you need, when you need it.  We recommend signing up for the patient portal called "MyChart".  Sign up information is provided on this After Visit Summary.  MyChart is used to connect with patients for Virtual Visits (Telemedicine).  Patients are able to view lab/test results, encounter notes, upcoming appointments, etc.  Non-urgent messages can be sent to your provider as well.   To learn more about what you can do with MyChart, go to [email or web address].    Your next  appointment:    As Needed   The format for your next appointment:   In Person  Provider:   Charlton Haws, MD   Other Instructions Thank you for choosing  [facility]!

## 2020-08-14 NOTE — Progress Notes (Signed)
Date:  08/14/2020   ID:  Gina Wood, DOB 23-May-1961, MRN [id number]  PCP:  Patient, No Pcp Per  Cardiologist: Previously Dr. Purvis Sheffield --> new to me   Electrophysiologist:  None   Chief Complaint: Added to schedule for palpitations   History of Present Illness:    Gina Wood is a 60 y.o. female with past medical history of , anxiety atypical chest pain IBS, insomnia, RLS palpitations, HTN and GERD Added on to schedule for palpitations Previously seen by Dr Purvis Sheffield   Previous history of palpitations on propanolol Initially worse during menopause    She remains on short-acting Propranolol as she reports being intolerant to extended release dosing in the past.   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    Past Medical History:  Diagnosis Date  . Anxiety disorder   . Chest pain, unspecified   . GERD (gastroesophageal reflux disease)   . HTN (hypertension)   . IBS (irritable bowel syndrome)   . Insomnia   . Palpitations    Past Surgical History:  Procedure Laterality Date  . OPEN REDUCTION INTERNAL FIXATION (ORIF) DISTAL RADIAL FRACTURE Right 05/16/2016   Procedure: OPEN REDUCTION INTERNAL FIXATION (ORIF) RIGHT  DISTAL RADIAL FRACTURE;  Surgeon: Cindee Salt, MD;  Location: [facility];  Service: Orthopedics;  Laterality: Right;     Current Meds  Medication Sig  . omeprazole (PRILOSEC OTC) 20 MG tablet Take 40 mg by mouth at bedtime.  . propranolol (INDERAL) 80 MG tablet Take 1 tablet (80 mg total) by mouth daily.     Allergies:   Penicillins   Social  History   Tobacco Use  . Smoking status: Never Smoker  . Smokeless tobacco: Never Used  Vaping Use  . Vaping Use: Never used  Substance Use Topics  . Alcohol use: Yes    Alcohol/week: 14.0 standard drinks    Types: 14 Glasses of wine per week  . Drug use: No     Family Hx: The patient's family history includes Hypertension in her father; Stroke in her maternal grandfather; Uterine cancer in her mother.  ROS:   Please see the history of present illness.     All other systems reviewed and are negative.   Prior CV studies:   The following studies were reviewed today:  Renal US: 01/2018 IMPRESSION: 1. The gallbladder wall is somewhat ill-defined. Irregularity in this area is questioned. There may be a degree of cystitis.  2.  Kidneys appear normal bilaterally.  3.  Cholelithiasis present.  Labs/Other Tests and Data Reviewed:    EKG:  08/14/2020 NSR normal eCG rate 62 bpm  Recent Labs: 10/27/2019: ALT 17; BUN 22; Creat 0.73; Hemoglobin 14.3; Platelets 291; Potassium 5.0; Sodium 139; TSH 1.63   Recent Lipid Panel Lab Results  Component Value Date/Time   CHOL 221 (H) 10/27/2019 08:59 AM   TRIG 104 10/27/2019 08:59 AM   HDL 59 10/27/2019 08:59 AM   CHOLHDL 3.7 10/27/2019 08:59 AM   LDLCALC 140 (H) 10/27/2019 08:59 AM    Wt Readings from Last 3  Encounters:  05/16/16 105.7 kg  02/24/13 79.4 kg  12/06/10 79.4 kg     Objective:    Vital Signs:  BP (!) 142/70   Pulse 64   Ht 5' 3.5" (1.613 m)   SpO2 96%   BMI 40.63 kg/m    Affect appropriate Healthy:  appears stated age HEENT: normal Neck supple with no adenopathy JVP normal no bruits no thyromegaly Lungs clear with no wheezing and good diaphragmatic motion Heart:  S1/S2 no murmur, no rub, gallop or click PMI normal Abdomen: benighn, BS positve, no tenderness, no AAA no bruit.  No HSM or HJR Distal pulses intact with no bruits No edema Neuro non-focal Skin warm and dry No muscular  weakness   ASSESSMENT & PLAN:    1. Palpitations - Chronic and seem to be related to stress and previously menapause no need for monitor continue beta blocker Would do TTE to r/o structural disease In combination with low calcium score and normal ECG this would  Cement benign nature of palpitations   2. HTN low sodium DASH diet continue Inderal  -    3. HLD -  09/2019 showed total cholesterol was elevated to 221 with HDL at 59, triglycerides 104 and LDL 140. She reviewed this with her Gynecologist at that time and has wished to focus on dietary changes currently.  4. CAD: risk discussed utility of calcium score she would like to have     Medication Adjustments/Labs and Tests Ordered: Current medicines are reviewed at length with the patient today.  Concerns regarding medicines are outlined above.   Tests Ordered: No orders of the defined types were placed in this encounter.   Medication Changes: No orders of the defined types were placed in this encounter.   Follow Up:  PrN   Signed, Charlton Haws, MD  08/14/2020 2:27 PM    [facility]

## 2020-08-15 NOTE — Addendum Note (Signed)
Addended by: Kerney Elbe on: 08/15/2020 04:38 PM   Modules accepted: Orders

## 2020-09-11 ENCOUNTER — Other Ambulatory Visit: Payer: Self-pay

## 2020-09-11 ENCOUNTER — Ambulatory Visit (INDEPENDENT_AMBULATORY_CARE_PROVIDER_SITE_OTHER)
Admission: RE | Admit: 2020-09-11 | Discharge: 2020-09-11 | Disposition: A | Discharge status: Home / Self Care | Payer: Self-pay | Source: Ambulatory Visit | Attending: Cardiovascular Disease | Admitting: Cardiovascular Disease

## 2020-09-11 ENCOUNTER — Other Ambulatory Visit (HOSPITAL_COMMUNITY): Payer: 59

## 2020-09-11 DIAGNOSIS — Z8249 Family history of ischemic heart disease and other diseases of the circulatory system: Secondary | ICD-10-CM

## 2020-11-14 ENCOUNTER — Other Ambulatory Visit: Payer: Self-pay

## 2020-11-14 ENCOUNTER — Ambulatory Visit
Admission: EM | Admit: 2020-11-14 | Discharge: 2020-11-14 | Disposition: A | Discharge status: Home / Self Care | Payer: 59

## 2020-11-14 ENCOUNTER — Telehealth: Payer: 59 | Admitting: Emergency Medicine

## 2020-11-14 DIAGNOSIS — U071 COVID-19: Secondary | ICD-10-CM

## 2020-11-14 NOTE — Progress Notes (Addendum)
    Virtual Visit via Video   I connected with patient on 11/14/20 at  1:30 PM EDT by a telephone and verified that I am speaking with the correct person using two identifiers.  Location patient: Home Location provider: [facility] Persons participating in the virtual visit: Patient, Provider  I discussed the limitations of evaluation and management by telemedicine and the availability of in person appointments. The patient expressed understanding and agreed to proceed.  Subjective:   HPI:   Patient presents via telephone today with chief complaint of covid 19.  She states that she tested positive today.  Has had minor GI symptoms and doesn't feel herself.  Reports associated low grade fever.  Is interested in COVID treatment.  ROS:   See pertinent positives and negatives per HPI.  Patient Active Problem List   Diagnosis Date Noted  . HTN (hypertension)   . Irritable bowel syndrome 12/06/2010  . Palpitations 04/13/2009  . CHEST PAIN-UNSPECIFIED 04/13/2009    Social History   Tobacco Use  . Smoking status: Never Smoker  . Smokeless tobacco: Never Used  Substance Use Topics  . Alcohol use: Yes    Alcohol/week: 14.0 standard drinks    Types: 14 Glasses of wine per week    Current Outpatient Medications:  .  calcium-vitamin D (OSCAL WITH D) 500-200 MG-UNIT TABS tablet, Take by mouth., Disp: , Rfl:  .  omeprazole (PRILOSEC OTC) 20 MG tablet, Take 40 mg by mouth at bedtime., Disp: , Rfl:  .  propranolol (INDERAL) 80 MG tablet, Take 1 tablet (80 mg total) by mouth daily., Disp: 90 tablet, Rfl: 3  Allergies  Allergen Reactions  . Penicillins Hives    Objective:   There were no vitals taken for this visit.  Telephone visit.  No labored breathing.  Speech is clear and coherent with logical content.  Patient is alert and oriented at baseline.   Assessment and Plan:   1. Covid -19  -Referral to infusion center for monoclonal therapy  - Should  receive a call within 24 hours.  28 minutes was used for this visit.   Roxy Horseman, PA-C 11/14/2020

## 2020-11-15 ENCOUNTER — Other Ambulatory Visit: Payer: Self-pay | Admitting: Adult Health

## 2020-11-15 ENCOUNTER — Ambulatory Visit (INDEPENDENT_AMBULATORY_CARE_PROVIDER_SITE_OTHER): Payer: 59

## 2020-11-15 DIAGNOSIS — U071 COVID-19: Secondary | ICD-10-CM

## 2020-11-15 DIAGNOSIS — J1282 Pneumonia due to coronavirus disease 2019: Secondary | ICD-10-CM

## 2020-11-15 MED ORDER — METHYLPREDNISOLONE SODIUM SUCC 125 MG IJ SOLR: 125.0000 mg | Freq: Once | INTRAMUSCULAR | Status: AC | PRN

## 2020-11-15 MED ORDER — SODIUM CHLORIDE 0.9 % IV SOLN: INTRAVENOUS | Status: DC | PRN

## 2020-11-15 MED ORDER — ALBUTEROL SULFATE HFA 108 (90 BASE) MCG/ACT IN AERS: 2.0000 | INHALATION_SPRAY | Freq: Once | RESPIRATORY_TRACT | Status: AC | PRN

## 2020-11-15 MED ORDER — EPINEPHRINE 0.3 MG/0.3ML IJ SOAJ: 0.3000 mg | Freq: Once | INTRAMUSCULAR | Status: AC | PRN

## 2020-11-15 MED ORDER — BEBTELOVIMAB 175 MG/2 ML IV (EUA)
175.0000 mg | Freq: Once | INTRAMUSCULAR | Status: AC
  Administered 2020-11-15: 175 mg via INTRAVENOUS

## 2020-11-15 MED ORDER — DIPHENHYDRAMINE HCL 50 MG/ML IJ SOLN: 50.0000 mg | Freq: Once | INTRAMUSCULAR | Status: AC | PRN

## 2020-11-15 MED ORDER — FAMOTIDINE IN NACL 20-0.9 MG/50ML-% IV SOLN: 20.0000 mg | Freq: Once | INTRAVENOUS | Status: AC | PRN

## 2020-11-15 NOTE — Progress Notes (Signed)
I connected by phone with Gina Wood on 11/15/2020 at 2:04 PM to discuss the potential use of a new treatment for mild to moderate COVID-19 viral infection in non-hospitalized patients.  This patient is a 60 y.o. female that meets the FDA criteria for Emergency Use Authorization of COVID monoclonal antibody bebtelovimab.  Has a (+) direct SARS-CoV-2 viral test result  Has mild or moderate COVID-19   Is NOT hospitalized due to COVID-19  Is within 10 days of symptom onset  Has at least one of the high risk factor(s) for progression to severe COVID-19 and/or hospitalization as defined in EUA.  Specific high risk criteria : BMI > 25 and Cardiovascular disease or hypertension   I have spoken and communicated the following to the patient or parent/caregiver regarding COVID monoclonal antibody treatment:  1. FDA has authorized the emergency use for the treatment of mild to moderate COVID-19 in adults and pediatric patients with positive results of direct SARS-CoV-2 viral testing who are 2 years of age and older weighing at least 40 kg, and who are at high risk for progressing to severe COVID-19 and/or hospitalization.  2. The significant known and potential risks and benefits of COVID monoclonal antibody, and the extent to which such potential risks and benefits are unknown.  3. Information on available alternative treatments and the risks and benefits of those alternatives, including clinical trials.  4. Patients treated with COVID monoclonal antibody should continue to self-isolate and use infection control measures (e.g., wear mask, isolate, social distance, avoid sharing personal items, clean and disinfect "high touch" surfaces, and frequent handwashing) according to CDC guidelines.   5. The patient or parent/caregiver has the option to accept or refuse COVID monoclonal antibody treatment.  6. Discussion about the monoclonal antibody infusion does not ensure treatment. The patient will be  placed on a list and scheduled according to risk, symptom onset and availability. A scheduler will reach to the patient to let them know if we can accommodate their infusion or not.  After reviewing this information with the patient, the patient has agreed to receive one of the available covid 19 monoclonal antibodies and will be provided an appropriate fact sheet prior to infusion. Cost reviewed, has DHHS # to call if unable to get appt at [facility].   Noreene Filbert, NP 11/15/2020 2:04 PM

## 2020-11-15 NOTE — Patient Instructions (Signed)
10 Things You Can Do to Manage Your COVID-19 Symptoms at Home If you have possible or confirmed COVID-19: 1. Stay home except to get medical care. 2. Monitor your symptoms carefully. If your symptoms get worse, call your healthcare provider immediately. 3. Get rest and stay hydrated. 4. If you have a medical appointment, call the healthcare provider ahead of time and tell them that you have or may have COVID-19. 5. For medical emergencies, call 911 and notify the dispatch personnel that you have or may have COVID-19. 6. Cover your cough and sneezes with a tissue or use the inside of your elbow. 7. Wash your hands often with soap and water for at least 20 seconds or clean your hands with an alcohol-based hand sanitizer that contains at least 60% alcohol. 8. As much as possible, stay in a specific room and away from other people in your home. Also, you should use a separate bathroom, if available. If you need to be around other people in or outside of the home, wear a mask. 9. Avoid sharing personal items with other people in your household, like dishes, towels, and bedding. 10. Clean all surfaces that are touched often, like counters, tabletops, and doorknobs. Use household cleaning sprays or wipes according to the label instructions. cdc.gov/coronavirus 01/14/2020 This information is not intended to replace advice given to you by your health care provider. Make sure you discuss any questions you have with your health care provider. Document Revised: 05/01/2020 Document Reviewed: 05/01/2020 Elsevier Patient Education  2021 Elsevier Inc.  What types of side effects do monoclonal antibody drugs cause?  Common side effects  In general, the more common side effects caused by monoclonal antibody drugs include: . Allergic reactions, such as hives or itching . Flu-like signs and symptoms, including chills, fatigue, fever, and muscle aches and pains . Nausea, vomiting . Diarrhea . Skin  rashes . Low blood pressure   The CDC is recommending patients who receive monoclonal antibody treatments wait at least 90 days before being vaccinated.  Currently, there are no data on the safety and efficacy of mRNA COVID-19 vaccines in persons who received monoclonal antibodies or convalescent plasma as part of COVID-19 treatment. Based on the estimated half-life of such therapies as well as evidence suggesting that reinfection is uncommon in the 90 days after initial infection, vaccination should be deferred for at least 90 days, as a precautionary measure until additional information becomes available, to avoid interference of the antibody treatment with vaccine-induced immune responses.  

## 2020-11-15 NOTE — Progress Notes (Signed)
Diagnosis: COVID  Provider:  Chilton Greathouse, MD  Procedure: Infusion  IV Type: Peripheral, IV Location: L Antecubital  Bebtelovimab, Dose: 175 mg  Infusion Start Time: 1631  Infusion Stop Time: 1632  Post Infusion IV Care: Observation period completed and Peripheral IV Discontinued  Discharge: Condition: Good, Destination: Home . AVS provided to patient.   Performed by:  Nat Math, RN

## 2021-01-23 NOTE — Progress Notes (Deleted)
Date:  01/23/2021   ID:  Gina Wood, DOB 1960/07/11, MRN [id number]  PCP:  Patient, No Pcp Per (Inactive)  Cardiologist: Eden Emms   Electrophysiologist:  None   Chief Complaint: Palpitations   History of Present Illness:    Gina Wood is a 60 y.o. female with past medical history of , anxiety atypical chest pain IBS, insomnia, RLS palpitations, HTN and GERD seen for palpitations in past   Previous history of palpitations on propanolol Initially worse during menopause    She remains on short-acting Propranolol as she reports being intolerant to extended release dosing in the past.   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 Seems to have chronic job stress   She had palpitations in exam room and at time pulse was normal   Covid positive in May received monoclonal antibody  Calcium score 09/11/20 0 low risk Did nut have f/u echo done   ***   Past Medical History:  Diagnosis Date   Anxiety disorder    Chest pain, unspecified    GERD (gastroesophageal reflux disease)    HTN (hypertension)    IBS (irritable bowel syndrome)    Insomnia    Palpitations    Past Surgical History:  Procedure Laterality Date   OPEN REDUCTION INTERNAL FIXATION (ORIF) DISTAL RADIAL FRACTURE Right 05/16/2016   Procedure: OPEN REDUCTION INTERNAL FIXATION (ORIF) RIGHT  DISTAL RADIAL FRACTURE;  Surgeon: Cindee Salt, MD;  Location: [facility];  Service: Orthopedics;  Laterality: Right;     No outpatient medications have been marked as taking for the 01/24/21 encounter (Appointment) with Wendall Stade, MD.   Current Facility-Administered Medications for the 01/24/21 encounter (Appointment) with Wendall Stade, MD  Medication   0.9 %  sodium chloride infusion     Allergies:   Penicillins   Social History    Tobacco Use   Smoking status: Never   Smokeless tobacco: Never  Vaping Use   Vaping Use: Never used  Substance Use Topics   Alcohol use: Yes    Alcohol/week: 14.0 standard drinks    Types: 14 Glasses of wine per week   Drug use: No     Family Hx: The patient's family history includes Hypertension in her father; Stroke in her maternal grandfather; Uterine cancer in her mother.  ROS:   Please see the history of present illness.     All other systems reviewed and are negative.   Prior CV studies:   The following studies were reviewed today:  Renal US: 01/2018 IMPRESSION: 1. The gallbladder wall is somewhat ill-defined. Irregularity in this area is questioned. There may be a degree of cystitis.   2.  Kidneys appear normal bilaterally.   3.  Cholelithiasis present.  Labs/Other Tests and Data Reviewed:    EKG:  01/23/2021 NSR normal eCG rate 62 bpm  Recent Labs: No results found for requested labs within last 8760 hours.   Recent Lipid Panel Lab Results  Component Value Date/Time   CHOL 221 (H) 10/27/2019 08:59 AM   TRIG 104 10/27/2019 08:59 AM   HDL 59 10/27/2019 08:59 AM   CHOLHDL 3.7 10/27/2019 08:59 AM   LDLCALC 140 (H) 10/27/2019 08:59 AM    Wt Readings from Last 3 Encounters:  05/16/16 105.7 kg  02/24/13 79.4 kg  12/06/10  79.4 kg     Objective:    Vital Signs:  There were no vitals taken for this visit.   Affect appropriate Healthy:  appears stated age HEENT: normal Neck supple with no adenopathy JVP normal no bruits no thyromegaly Lungs clear with no wheezing and good diaphragmatic motion Heart:  S1/S2 no murmur, no rub, gallop or click PMI normal Abdomen: benighn, BS positve, no tenderness, no AAA no bruit.  No HSM or HJR Distal pulses intact with no bruits No edema Neuro non-focal Skin warm and dry No muscular weakness   ASSESSMENT & PLAN:    1. Palpitations - Chronic and seem to be related to stress and previously menapause no  need for monitor continue beta blocker ECG normal calcium score 0 previously discussed TTE to r/o structural disease ***   2. HTN low sodium DASH diet continue Inderal  -    3. HLD -  09/2019 showed total cholesterol was elevated to 221 with HDL at 59, triglycerides 104 and LDL 140. She reviewed this with her Gynecologist at that time and has wished to focus on dietary changes currently.  4. CAD: risk calcium score 0 09/11/20 low risk      Medication Adjustments/Labs and Tests Ordered: Current medicines are reviewed at length with the patient today.  Concerns regarding medicines are outlined above.   Tests Ordered: No orders of the defined types were placed in this encounter.  Echo  Medication Changes: No orders of the defined types were placed in this encounter.   Follow Up:  PRN    Signed, Charlton Haws, MD  01/23/2021 3:58 PM    [facility]

## 2021-01-24 ENCOUNTER — Ambulatory Visit (INDEPENDENT_AMBULATORY_CARE_PROVIDER_SITE_OTHER): Payer: 59

## 2021-01-24 ENCOUNTER — Other Ambulatory Visit: Payer: Self-pay | Admitting: Student

## 2021-01-24 ENCOUNTER — Ambulatory Visit: Payer: 59 | Admitting: Cardiovascular Disease

## 2021-01-24 DIAGNOSIS — R002 Palpitations: Secondary | ICD-10-CM

## 2021-01-25 DIAGNOSIS — R002 Palpitations: Secondary | ICD-10-CM

## 2021-02-11 ENCOUNTER — Other Ambulatory Visit: Payer: Self-pay | Admitting: Student

## 2021-02-11 MED ORDER — PROPRANOLOL HCL 40 MG PO TABS: 40.0000 mg | ORAL_TABLET | Freq: Two times a day (BID) | ORAL | 2 refills | Status: DC

## 2021-02-16 ENCOUNTER — Ambulatory Visit: Payer: 59 | Admitting: Cardiovascular Disease

## 2021-02-19 ENCOUNTER — Ambulatory Visit: Payer: 59 | Admitting: Cardiovascular Disease

## 2021-04-28 ENCOUNTER — Other Ambulatory Visit: Payer: Self-pay | Admitting: Student

## 2021-05-02 NOTE — Progress Notes (Signed)
Cardiology Office Note    Date:  05/03/2021   ID:  Gina Wood, DOB 07-18-1960, MRN [id number]  PCP: No PCP Currently Cardiologist: Charlton Haws, MD    Chief Complaint  Patient presents with   Follow-up    History of Present Illness:    Gina Wood is a 60 y.o. female with past medical history of palpitations, HTN and GERD who presents to the office today for routine follow-up.  She was examined by Dr. Eden Emms in 08/2020 and reported having intermittent palpitations and it was felt her symptoms were overall secondary to stress. It was recommended that she have a transthoracic echocardiogram along with coronary calcium score. Her coronary calcium score was performed on 09/11/2020 and was 0.  She did contact the office in 12/2020 and reported still having intermittent palpitations and a 7-day Zio patch was placed. This showed predominantly normal sinus rhythm with an isolated episode of atrial tachycardia for only 5 beats and overall occasional PAC's and PVC's but less than a 1% burden. Different medication options were reviewed but she wished to remain on Propanolol and was encouraged to take Propanolol 40 mg twice daily.  In talking with the patient today, she reports overall doing well since her last office visit. She has only had to utilize Propanolol once and does not take this on a regular basis. She does consume a few cups of coffee daily and occasional soda but no recent change in intake. Consumes approximately 2 glasses of wine on a nightly basis as well. She is unaware of any change in routine or triggers for her palpitations when they do occur. She reports her breathing has overall been stable when walking on flat surfaces and she does have occasional shortness of breath when walking up inclines but this has been unchanged. No associated chest pain, orthopnea, PND or pitting edema.   Past Medical History:  Diagnosis Date   Anxiety disorder    Chest pain, unspecified    GERD  (gastroesophageal reflux disease)    HTN (hypertension)    IBS (irritable bowel syndrome)    Insomnia    Palpitations     Past Surgical History:  Procedure Laterality Date   OPEN REDUCTION INTERNAL FIXATION (ORIF) DISTAL RADIAL FRACTURE Right 05/16/2016   Procedure: OPEN REDUCTION INTERNAL FIXATION (ORIF) RIGHT  DISTAL RADIAL FRACTURE;  Surgeon: Cindee Salt, MD;  Location: [facility];  Service: Orthopedics;  Laterality: Right;    Current Medications: Outpatient Medications Prior to Visit  Medication Sig Dispense Refill   omeprazole (PRILOSEC OTC) 20 MG tablet Take 40 mg by mouth at bedtime.     propranolol (INDERAL) 40 MG tablet Take 1 tablet (40 mg total) by mouth 2 (two) times daily. 180 tablet 2   propranolol (INDERAL) 80 MG tablet TAKE 1 TABLET BY MOUTH EVERY DAY 90 tablet 3   calcium-vitamin D (OSCAL WITH D) 500-200 MG-UNIT TABS tablet Take by mouth. (Patient not taking: Reported on 05/03/2021)     Facility-Administered Medications Prior to Visit  Medication Dose Route Frequency Provider Last Rate Last Admin   0.9 %  sodium chloride infusion   Intravenous PRN Causey, Larna Daughters, NP         Allergies:   Penicillins   Social History   Socioeconomic History   Marital status: Single    Spouse name: Not on file   Number of children: Not on file   Years of education: Not on file   Highest education level: Not on file  Occupational History   Occupation: [employer]  Tobacco Use   Smoking status: Never   Smokeless tobacco: Never  Vaping Use   Vaping Use: Never used  Substance and Sexual Activity   Alcohol use: Yes    Alcohol/week: 14.0 standard drinks    Types: 14 Glasses of wine per week   Drug use: No   Sexual activity: Not Currently    Comment: 1st intercourse 27yo-5 partners  Other Topics Concern   Not on file  Social History Narrative   Not on file   Social Determinants of Health   Financial Resource Strain: Not on file  Food  Insecurity: Not on file  Transportation Needs: Not on file  Physical Activity: Not on file  Stress: Not on file  Social Connections: Not on file     Family History:  The patient's family history includes Hypertension in her father; Stroke in her maternal grandfather; Uterine cancer in her mother.   Review of Systems:    Please see the history of present illness.     All other systems reviewed and are otherwise negative except as noted above.   Physical Exam:    VS:  BP 128/78   Pulse 60   Ht 5' (1.524 m)   SpO2 98%   BMI 45.50 kg/m    General: Well developed, well nourished,female appearing in no acute distress. Head: Normocephalic, atraumatic. Neck: No carotid bruits. JVD not elevated.  Lungs: Respirations regular and unlabored, without wheezes or rales.  Heart: Regular rate and rhythm. No S3 or S4.  No murmur, no rubs, or gallops appreciated. Abdomen: Appears non-distended. No obvious abdominal masses. Msk:  Strength and tone appear normal for age. No obvious joint deformities or effusions. Extremities: No clubbing or cyanosis. No pitting edema.  Distal pedal pulses are 2+ bilaterally. Neuro: Alert and oriented X 3. Moves all extremities spontaneously. No focal deficits noted. Psych:  Responds to questions appropriately with a normal affect. Skin: Area of erythema and mild drainage along left inner thigh.   Wt Readings from Last 3 Encounters:  05/16/16 233 lb (105.7 kg)  02/24/13 175 lb (79.4 kg)  12/06/10 175 lb (79.4 kg)     Studies/Labs Reviewed:   EKG:  EKG is not ordered today.   Recent Labs: No results found for requested labs within last 8760 hours.   Lipid Panel    Component Value Date/Time   CHOL 221 (H) 10/27/2019 0859   TRIG 104 10/27/2019 0859   HDL 59 10/27/2019 0859   CHOLHDL 3.7 10/27/2019 0859   VLDL 24 01/08/2018 0803   LDLCALC 140 (H) 10/27/2019 0859    Additional studies/ records that were reviewed today include:   Coronary Calcium  Score: 08/2020 IMPRESSION: 1. Coronary calcium score of 0. This was 0 percentile for age and sex matched control.    Event Monitor: 01/2021 Patch Wear Time:  6 days and 15 hours (2022-07-28T07:00:51-0400 to 2022-08-03T22:47:32-0400)   Patient had a min HR of 51 bpm, max HR of 124 bpm, and avg HR of 65 bpm. Predominant underlying rhythm was Sinus Rhythm. 1 run of Supraventricular Tachycardia occurred lasting 5 beats with a max rate of 124 bpm (avg 105 bpm). Isolated SVEs were rare  (<1.0%), SVE Couplets were rare (<1.0%), and SVE Triplets were rare (<1.0%). Isolated VEs were rare (<1.0%), VE Couplets were rare (<1.0%), and no VE Triplets were present. Inverted QRS complexes possibly due to inverted placement of device.   Summary:  NSR average HR 65  bpm. Isolated self limited episode atrial tachycardia only 5 beats PAC<1% beats PVC < 1% beats no significant arrhythmias  Assessment:    1. Heart palpitations   2. Medication management   3. Screening for diabetes mellitus (DM)   4. Screening for cholesterol level   5. Furuncle of lower leg      Plan:   In order of problems listed above:  1. Palpitations - She does experience occasional palpitations but symptoms have overall improved over the past few months. Recent monitor was reassuring and showed only an isolated episode of atrial tachycardia and occasional PAC's and PVC's but an overall burden of less than 1%. - She prefers to take Propanolol on an as-needed basis as compared to daily. Continue with PRN Propanolol 40mg  BID. Will check TSH, CBC and CMET for screening. We reviewed the role of reduction in caffeine and alcohol intake if she has recurrent symptoms.  2. Screening for Diabetes - Hgb A1c has been variable between 5.6 - 5.8 over the past 3 years. Given no recent labs, will recheck.   3. Screening for Lipids - FLP in 09/2019 showed total cholesterol 221, HDL 59 and LDL 140. She preferred dietary changes at that time and  given no recent labs, will recheck with upcoming lab work.  4. Boil along Left Leg - She reports intermittent boils along her left upper thigh which have been occurring intermittently for the past several months and the area opened yesterday and she has experienced drainage since. She does have erythema around the area and mild drainage today.  Given that she does not have a PCP, will order Keflex 500mg  BID for 7 days to cover for a skin infection. I encouraged her to follow-up with Dermatology if symptoms do not improve.   Medication Adjustments/Labs and Tests Ordered: Current medicines are reviewed at length with the patient today.  Concerns regarding medicines are outlined above.  Medication changes, Labs and Tests ordered today are listed in the Patient Instructions below. Patient Instructions  Medication Instructions:   Your physician recommends that you continue on your current medications as directed. Please refer to the Current Medication list given to you today.  *If you need a refill on your cardiac medications before your next appointment, please call your pharmacy*   Lab Work:  CBC,BMET,TSH,A1C,Lipids  If you have labs (blood work) drawn today and your tests are completely normal, you will receive your results only by: MyChart Message (if you have MyChart) OR A paper copy in the mail If you have any lab test that is abnormal or we need to change your treatment, we will call you to review the results.   Testing/Procedures: None today    Follow-Up: At [facility], you and your health needs are our priority.  As part of our continuing mission to provide you with exceptional heart care, we have created designated Provider Care Teams.  These Care Teams include your primary Cardiologist (physician) and Advanced Practice Providers (APPs -  Physician Assistants and Nurse Practitioners) who all work together to provide you with the care you need, when you need it.  We  recommend signing up for the patient portal called "MyChart".  Sign up information is provided on this After Visit Summary.  MyChart is used to connect with patients for Virtual Visits (Telemedicine).  Patients are able to view lab/test results, encounter notes, upcoming appointments, etc.  Non-urgent messages can be sent to your provider as well.   To learn more about what  you can do with MyChart, go to [email or web address].    Your next appointment:   12 month(s)  The format for your next appointment:   In Person  Provider:   Charlton Haws, MD or Randall An, PA-C   Other Instructions None        Thank you for choosing [facility] !         Signed, Ellsworth Lennox, PA-C  05/03/2021 4:52 PM    [facility] [address] [place], [place] [address] Phone: [phone] Fax: [phone]

## 2021-05-03 ENCOUNTER — Encounter: Payer: Self-pay | Admitting: Student

## 2021-05-03 ENCOUNTER — Ambulatory Visit (INDEPENDENT_AMBULATORY_CARE_PROVIDER_SITE_OTHER): Payer: 59 | Admitting: Student

## 2021-05-03 ENCOUNTER — Other Ambulatory Visit: Payer: Self-pay

## 2021-05-03 VITALS — BP 128/78 | HR 60 | Ht 60.0 in

## 2021-05-03 DIAGNOSIS — R002 Palpitations: Secondary | ICD-10-CM | POA: Diagnosis not present

## 2021-05-03 DIAGNOSIS — Z13220 Encounter for screening for lipoid disorders: Secondary | ICD-10-CM | POA: Diagnosis not present

## 2021-05-03 DIAGNOSIS — Z131 Encounter for screening for diabetes mellitus: Secondary | ICD-10-CM

## 2021-05-03 DIAGNOSIS — Z79899 Other long term (current) drug therapy: Secondary | ICD-10-CM

## 2021-05-03 DIAGNOSIS — L02429 Furuncle of limb, unspecified: Secondary | ICD-10-CM

## 2021-05-03 MED ORDER — CEPHALEXIN 500 MG PO CAPS: 500.0000 mg | ORAL_CAPSULE | Freq: Two times a day (BID) | ORAL | 0 refills | Status: AC

## 2021-05-03 NOTE — Patient Instructions (Signed)
Medication Instructions:   Your physician recommends that you continue on your current medications as directed. Please refer to the Current Medication list given to you today.  *If you need a refill on your cardiac medications before your next appointment, please call your pharmacy*   Lab Work:  CBC,BMET,TSH,A1C,Lipids  If you have labs (blood work) drawn today and your tests are completely normal, you will receive your results only by: MyChart Message (if you have MyChart) OR A paper copy in the mail If you have any lab test that is abnormal or we need to change your treatment, we will call you to review the results.   Testing/Procedures: None today    Follow-Up: At [facility], you and your health needs are our priority.  As part of our continuing mission to provide you with exceptional heart care, we have created designated Provider Care Teams.  These Care Teams include your primary Cardiologist (physician) and Advanced Practice Providers (APPs -  Physician Assistants and Nurse Practitioners) who all work together to provide you with the care you need, when you need it.  We recommend signing up for the patient portal called "MyChart".  Sign up information is provided on this After Visit Summary.  MyChart is used to connect with patients for Virtual Visits (Telemedicine).  Patients are able to view lab/test results, encounter notes, upcoming appointments, etc.  Non-urgent messages can be sent to your provider as well.   To learn more about what you can do with MyChart, go to [email or web address].    Your next appointment:   12 month(s)  The format for your next appointment:   In Person  Provider:   Charlton Haws, MD or Randall An, PA-C   Other Instructions None        Thank you for choosing [facility] !

## 2021-10-10 ENCOUNTER — Other Ambulatory Visit (HOSPITAL_COMMUNITY): Payer: Self-pay | Admitting: Student

## 2021-11-20 ENCOUNTER — Ambulatory Visit: Payer: Managed Care, Other (non HMO) | Admitting: Radiology

## 2021-11-29 HISTORY — PX: CARPAL TUNNEL RELEASE: SHX101

## 2022-02-11 ENCOUNTER — Telehealth: Payer: Self-pay

## 2022-02-11 DIAGNOSIS — R002 Palpitations: Secondary | ICD-10-CM

## 2022-02-11 DIAGNOSIS — Z13220 Encounter for screening for lipoid disorders: Secondary | ICD-10-CM

## 2022-02-11 DIAGNOSIS — Z79899 Other long term (current) drug therapy: Secondary | ICD-10-CM

## 2022-02-11 DIAGNOSIS — Z131 Encounter for screening for diabetes mellitus: Secondary | ICD-10-CM

## 2022-02-11 NOTE — Telephone Encounter (Signed)
  C[place] - Please enter orders for CBC, CMET, FLP, Hgb A1c and TSH. Can use palpitations and med management for CBC, CMET and TSH. FLP would be screening for lipid disorder and Hgb A1c would be for screening for diabetes.   Thanks,  [place]

## 2022-02-13 ENCOUNTER — Other Ambulatory Visit (HOSPITAL_COMMUNITY)
Admission: RE | Admit: 2022-02-13 | Discharge: 2022-02-13 | Disposition: A | Discharge status: Home / Self Care | Payer: Managed Care, Other (non HMO) | Source: Ambulatory Visit | Attending: Student | Admitting: Student

## 2022-02-13 DIAGNOSIS — Z79899 Other long term (current) drug therapy: Secondary | ICD-10-CM | POA: Insufficient documentation

## 2022-02-13 DIAGNOSIS — R002 Palpitations: Secondary | ICD-10-CM | POA: Diagnosis present

## 2022-02-13 DIAGNOSIS — Z131 Encounter for screening for diabetes mellitus: Secondary | ICD-10-CM

## 2022-02-13 DIAGNOSIS — Z13220 Encounter for screening for lipoid disorders: Secondary | ICD-10-CM | POA: Insufficient documentation

## 2022-02-13 LAB — LIPID PANEL
Cholesterol: 222 mg/dL — ABNORMAL HIGH (ref 0–200)
HDL: 65 mg/dL (ref >40)
LDL Cholesterol: 139 mg/dL — ABNORMAL HIGH (ref 0–99)
Total CHOL/HDL Ratio: 3.4 RATIO
Triglycerides: 89 mg/dL (ref <150)
VLDL: 18 mg/dL (ref 0–40)

## 2022-02-13 LAB — COMPREHENSIVE METABOLIC PANEL
ALT: 30 U/L (ref 0–44)
AST: 25 U/L (ref 15–41)
Albumin: 3.9 g/dL (ref 3.5–5.0)
Alkaline Phosphatase: 76 U/L (ref 38–126)
Anion gap: 7 (ref 5–15)
BUN: 19 mg/dL (ref 6–20)
CO2: 23 mmol/L (ref 22–32)
Calcium: 8.8 mg/dL — ABNORMAL LOW (ref 8.9–10.3)
Chloride: 109 mmol/L (ref 98–111)
Creatinine, Ser: 0.73 mg/dL (ref 0.44–1.00)
GFR, Estimated: >60 mL/min (ref >60)
Glucose, Bld: 154 mg/dL — ABNORMAL HIGH (ref 70–99)
Potassium: 4.5 mmol/L (ref 3.5–5.1)
Sodium: 139 mmol/L (ref 135–145)
Total Bilirubin: 0.5 mg/dL (ref 0.3–1.2)
Total Protein: 7.3 g/dL (ref 6.5–8.1)

## 2022-02-13 LAB — CBC
HCT: 43.6 % (ref 36.0–46.0)
Hemoglobin: 14.7 g/dL (ref 12.0–15.0)
MCH: 30.8 pg (ref 26.0–34.0)
MCHC: 33.7 g/dL (ref 30.0–36.0)
MCV: 91.2 fL (ref 80.0–100.0)
Platelets: 225 10*3/uL (ref 150–400)
RBC: 4.78 MIL/uL (ref 3.87–5.11)
RDW: 13.8 % (ref 11.5–15.5)
WBC: 7.4 10*3/uL (ref 4.0–10.5)
nRBC: 0 % (ref 0.0–0.2)

## 2022-02-13 LAB — TSH: TSH: 1.999 u[IU]/mL (ref 0.350–4.500)

## 2022-02-13 LAB — HEMOGLOBIN A1C
Hgb A1c MFr Bld: 5.9 % — ABNORMAL HIGH (ref 4.8–5.6)
Mean Plasma Glucose: 122.63 mg/dL

## 2022-02-14 ENCOUNTER — Other Ambulatory Visit: Payer: Self-pay | Admitting: *Deleted

## 2022-02-14 DIAGNOSIS — R002 Palpitations: Secondary | ICD-10-CM

## 2022-02-18 ENCOUNTER — Ambulatory Visit (HOSPITAL_BASED_OUTPATIENT_CLINIC_OR_DEPARTMENT_OTHER): Payer: Managed Care, Other (non HMO)

## 2022-04-10 ENCOUNTER — Ambulatory Visit: Payer: Managed Care, Other (non HMO) | Admitting: Radiology

## 2022-04-15 ENCOUNTER — Ambulatory Visit (HOSPITAL_BASED_OUTPATIENT_CLINIC_OR_DEPARTMENT_OTHER): Payer: Managed Care, Other (non HMO)

## 2022-04-15 DIAGNOSIS — R002 Palpitations: Secondary | ICD-10-CM | POA: Diagnosis not present

## 2022-04-15 LAB — ECHOCARDIOGRAM COMPLETE
Area-P 1/2: 4.21 cm2
MV M vel: 4.64 m/s
MV Peak grad: 86.1 mmHg
S' Lateral: 2.88 cm

## 2022-04-16 ENCOUNTER — Ambulatory Visit: Payer: Managed Care, Other (non HMO) | Admitting: Radiology

## 2022-04-24 ENCOUNTER — Ambulatory Visit: Payer: Managed Care, Other (non HMO) | Admitting: Radiology

## 2022-04-25 NOTE — Progress Notes (Signed)
Date:  05/06/2022   ID:  Gina Wood, DOB [birth year], MRN [id number]  PCP:  Pcp, No  Cardiologist: Previously Dr. Bronson Ing --[place]  Electrophysiologist:  None   Chief Complaint: Palpitations   History of Present Illness:    Gina Wood is a 61 y.o. female with past medical history of , anxiety atypical chest pain IBS, insomnia, RLS palpitations, HTN and GERD  Previously seen by Dr Bronson Ing   Monitor 01/24/21 low risk PAC/PVC;s < 1% total beats   Previous history of palpitations on propanolol Initially worse during menopause    She remains on short-acting Propranolol as she reports being intolerant to extended release dosing in the past.   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 Seems to have chronic job stress   She had palpitations in exam room and at time pulse was normal   Calcium score 09/11/20 was normal with score of 0  She switched most of her care over to [facility] She has some cervical spine issues and just had left carpal tunnel surgery    Past Medical History:  Diagnosis Date   Anxiety disorder    Chest pain, unspecified    GERD (gastroesophageal reflux disease)    HTN (hypertension)    IBS (irritable bowel syndrome)    Insomnia    Palpitations    Spinal stenosis    Past Surgical History:  Procedure Laterality Date   CARPAL TUNNEL RELEASE  11/2021   OPEN REDUCTION INTERNAL FIXATION (ORIF) DISTAL RADIAL FRACTURE Right 05/16/2016   Procedure: OPEN REDUCTION INTERNAL FIXATION (ORIF) RIGHT  DISTAL RADIAL FRACTURE;  Surgeon: Daryll Brod, MD;  Location: [facility];  Service: Orthopedics;  Laterality: Right;     Current Meds  Medication Sig   calcium-vitamin D (OSCAL WITH D) 500-200 MG-UNIT TABS tablet Take by mouth.   propranolol ER (INDERAL LA) 80 MG 24 hr  capsule Take 1 capsule (80 mg total) by mouth daily.   Current Facility-Administered Medications for the 05/06/22 encounter (Office Visit) with Josue Hector, MD  Medication   0.9 %  sodium chloride infusion     Allergies:   Penicillins   Social History   Tobacco Use   Smoking status: Never   Smokeless tobacco: Never  Vaping Use   Vaping Use: Never used  Substance Use Topics   Alcohol use: Yes    Alcohol/week: 14.0 standard drinks of alcohol    Types: 14 Glasses of wine per week   Drug use: No     Family Hx: The patient's family history includes Hypertension in her father; Stroke in her maternal grandfather; Uterine cancer in her mother.  ROS:   Please see the history of present illness.     All other systems reviewed and are negative.   Prior CV studies:   The following studies were reviewed today:  Renal US: 01/2018 IMPRESSION: 1. The gallbladder wall is somewhat ill-defined. Irregularity in this area is questioned. There may be a degree of cystitis.   2.  Kidneys appear normal bilaterally.   3.  Cholelithiasis present.  Labs/Other Tests and Data Reviewed:    EKG:  05/06/2022 NSR rate 61 poor R wave progression from body habitus   Recent Labs: 02/13/2022: ALT 30; BUN 19; Creatinine, Ser 0.73; Hemoglobin 14.7; Platelets 225; Potassium 4.5;  Sodium 139; TSH 1.999   Recent Lipid Panel Lab Results  Component Value Date/Time   CHOL 222 (H) 02/13/2022 08:02 AM   TRIG 89 02/13/2022 08:02 AM   HDL 65 02/13/2022 08:02 AM   CHOLHDL 3.4 02/13/2022 08:02 AM   LDLCALC 139 (H) 02/13/2022 08:02 AM   LDLCALC 140 (H) 10/27/2019 08:59 AM    Wt Readings from Last 3 Encounters:  05/16/16 233 lb (105.7 kg)  02/24/13 175 lb (79.4 kg)  12/06/10 175 lb (79.4 kg)     Objective:    Vital Signs:  BP 130/76   Pulse 62   Ht 5' 3.5" (1.613 m)   SpO2 97%   BMI 40.63 kg/m    Affect appropriate Overweight white female  HEENT: normal Neck supple with no adenopathy JVP  normal no bruits no thyromegaly Lungs clear with no wheezing and good diaphragmatic motion Heart:  S1/S2 no murmur, no rub, gallop or click PMI normal Abdomen: benighn, BS positve, no tenderness, no AAA no bruit.  No HSM or HJR Distal pulses intact with no bruits No edema Neuro non-focal Skin warm and dry Left carpal tunnel surgery    ASSESSMENT & PLAN:    1. Palpitations - Chronic and seem to be related to stress and previously menapause no need for monitor continue beta blocker, TTE 04/15/22 normal  -  2. HTN low sodium DASH diet continue Inderal  -    3. HLD -  09/2019 showed total cholesterol was elevated to 221 with HDL at 59, triglycerides 104 and LDL 140. She reviewed this with her Gynecologist at that time and has wished to focus on dietary changes currently.  4. CAD: low risk with calcium score 0 09/11/20   5. Ortho:  ? Cervical spine issues post carpal tunnel with persistent paresthesias in finger tips refer to Kary Kos if she wants 2 nd opinion      Medication Adjustments/Labs and Tests Ordered: Current medicines are reviewed at length with the patient today.  Concerns regarding medicines are outlined above.   Tests Ordered: Orders Placed This Encounter  Procedures   EKG 12-Lead    Medication Changes: Meds ordered this encounter  Medications   propranolol ER (INDERAL LA) 80 MG 24 hr capsule    Sig: Take 1 capsule (80 mg total) by mouth daily.    Dispense:  90 capsule    Refill:  3    Follow Up PRN  Signed, Jenkins Rouge, MD  05/06/2022 9:00 AM    [facility]

## 2022-04-29 ENCOUNTER — Other Ambulatory Visit: Payer: Self-pay | Admitting: Cardiovascular Disease

## 2022-05-01 MED ORDER — PROPRANOLOL HCL 40 MG PO TABS: 40.0000 mg | ORAL_TABLET | Freq: Two times a day (BID) | ORAL | 2 refills | Status: DC

## 2022-05-04 ENCOUNTER — Other Ambulatory Visit: Payer: Self-pay | Admitting: Cardiovascular Disease

## 2022-05-06 ENCOUNTER — Ambulatory Visit: Payer: Managed Care, Other (non HMO) | Attending: Cardiovascular Disease | Admitting: Cardiovascular Disease

## 2022-05-06 ENCOUNTER — Ambulatory Visit: Payer: 59 | Admitting: Cardiovascular Disease

## 2022-05-06 ENCOUNTER — Encounter: Payer: Self-pay | Admitting: Cardiovascular Disease

## 2022-05-06 VITALS — BP 130/76 | HR 62 | Ht 63.5 in

## 2022-05-06 DIAGNOSIS — I10 Essential (primary) hypertension: Secondary | ICD-10-CM

## 2022-05-06 DIAGNOSIS — R002 Palpitations: Secondary | ICD-10-CM | POA: Diagnosis not present

## 2022-05-06 DIAGNOSIS — E782 Mixed hyperlipidemia: Secondary | ICD-10-CM | POA: Diagnosis not present

## 2022-05-06 MED ORDER — PROPRANOLOL HCL ER 80 MG PO CP24: 80.0000 mg | ORAL_CAPSULE | Freq: Every day | ORAL | 3 refills | Status: DC

## 2022-05-06 NOTE — Patient Instructions (Signed)
Medication Instructions:  °Your physician recommends that you continue on your current medications as directed. Please refer to the Current Medication list given to you today. ° ° °Labwork: °None ° ° °Testing/Procedures: °None  ° °Follow-Up: °1 year ° °Any Other Special Instructions Will Be Listed Below (If Applicable). ° °If you need a refill on your cardiac medications before your next appointment, please call your pharmacy. ° °

## 2022-05-09 ENCOUNTER — Other Ambulatory Visit: Payer: Self-pay

## 2022-05-09 MED ORDER — PROPRANOLOL HCL 80 MG PO TABS: 80.0000 mg | ORAL_TABLET | Freq: Every day | ORAL | 3 refills | Status: DC

## 2022-06-05 ENCOUNTER — Ambulatory Visit: Payer: Managed Care, Other (non HMO) | Admitting: Radiology

## 2022-06-11 ENCOUNTER — Other Ambulatory Visit: Payer: Self-pay

## 2022-06-11 DIAGNOSIS — Z8249 Family history of ischemic heart disease and other diseases of the circulatory system: Secondary | ICD-10-CM

## 2022-06-11 NOTE — Progress Notes (Signed)
Wendall Stade, MD:   Ok to order carotid duplex     You routed conversation to Wendall Stade, MD   Gina Wood  P Cv [name] Scheduling    Thank you. 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 Atherosclerotic calcification of the right Carotid bifurcation. Can I have Dr. Dorris Carnes to order me a doppler please?

## 2022-06-18 ENCOUNTER — Ambulatory Visit (HOSPITAL_COMMUNITY): Payer: Managed Care, Other (non HMO)

## 2022-06-19 ENCOUNTER — Ambulatory Visit (HOSPITAL_COMMUNITY)
Admission: RE | Admit: 2022-06-19 | Discharge: 2022-06-19 | Disposition: A | Discharge status: Home / Self Care | Payer: Managed Care, Other (non HMO) | Source: Ambulatory Visit | Attending: Cardiovascular Disease | Admitting: Cardiovascular Disease

## 2022-06-19 ENCOUNTER — Telehealth: Payer: Self-pay | Admitting: *Deleted

## 2022-06-19 DIAGNOSIS — Z8249 Family history of ischemic heart disease and other diseases of the circulatory system: Secondary | ICD-10-CM | POA: Diagnosis present

## 2022-06-19 DIAGNOSIS — Z136 Encounter for screening for cardiovascular disorders: Secondary | ICD-10-CM | POA: Insufficient documentation

## 2022-06-19 NOTE — Telephone Encounter (Signed)
   Pre-operative Risk Assessment    Patient Name: Gina Wood  DOB: 01-03-1961 MRN: [id number]      Request for Surgical Clearance    Procedure:   POSTERIOR CERVICAL DECOMPRESSION AND FUSION C3-T1  Date of Surgery:  Clearance 08/12/22                                 Surgeon:  DR. Andria Meuse Surgeon's Group or Practice Name:  [facility] Phone number:  [phone] Fax number:  [phone]   Type of Clearance Requested:   - Medical ; NO MEDICATIONS LISTED AS NEEDING TO BE HELD   Type of Anesthesia:  General    Additional requests/questions:    Elpidio Anis   06/19/2022, 5:34 PM

## 2022-06-20 NOTE — Telephone Encounter (Signed)
     Primary Cardiologist: Charlton Haws, MD  Chart reviewed as part of pre-operative protocol coverage. Given past medical history and time since last visit, based on ACC/AHA guidelines, Gina Wood would be at acceptable risk for the planned procedure without further cardiovascular testing.   Her RCRI is a class I risk, 0.4% risk of major cardiac event.  I will route this recommendation to the requesting party via Epic fax function and remove from pre-op pool.  Please call with questions.  Thomasene Ripple. Seeley Hissong NP-C     06/20/2022, 12:03 PM [facility] [address] Office [phone] Fax [phone]

## 2022-07-05 IMAGING — CT CT CARDIAC CORONARY ARTERY CALCIUM SCORE
3 series · 14 of 20 positions shown, 15 images · non-contrast
Comparison: None.
COMPARISON: None.

Addendum:
EXAM:
OVER-READ INTERPRETATION  CT CHEST

The following report is an over-read performed by radiologist Dr.
Daniel Cristi Dpo [REDACTED] on 09/11/2020. This over-read
does not include interpretation of cardiac or coronary anatomy or
pathology. The coronary calcium score interpretation by the
cardiologist is attached.
CLINICAL DATA: Risk stratification 59 year old female
Coronary Calcium Score
TECHNIQUE: The patient was scanned on a Siemens Force scanner. Axial
non-contrast 3 mm slices were carried out through the heart. The
data set was analyzed on a dedicated work station and scored using
the Agatson method.

[Series 2: casc 3.0 bv41 2 bestdiast 71 % · axial · 0.41mm/px · z∈[-134,-62]mm · 4 of 40 slices shown, 5 images]
[im 8/40  vessel]
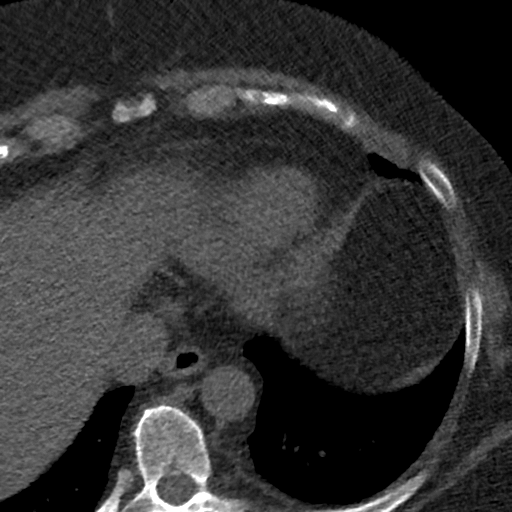
[im 8/40  lung]
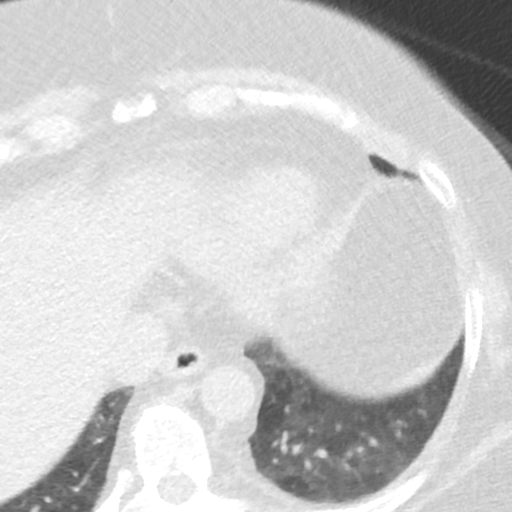
[im 16/40  vessel]
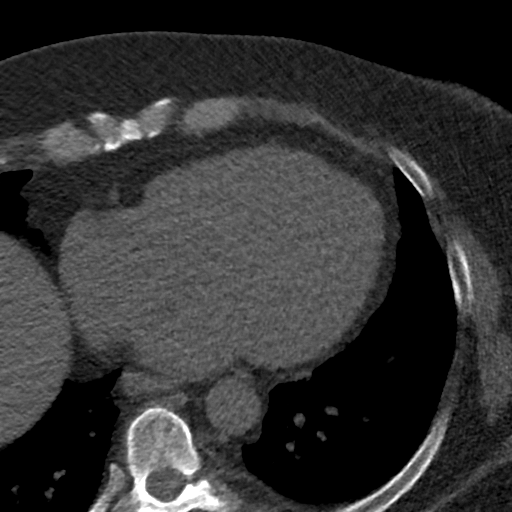
[im 24/40  vessel]
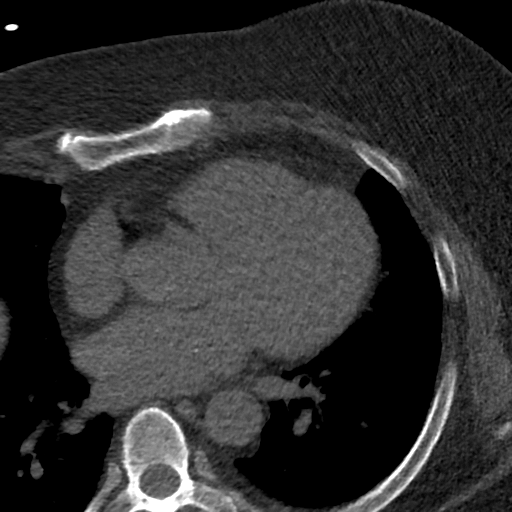
[im 32/40  vessel]
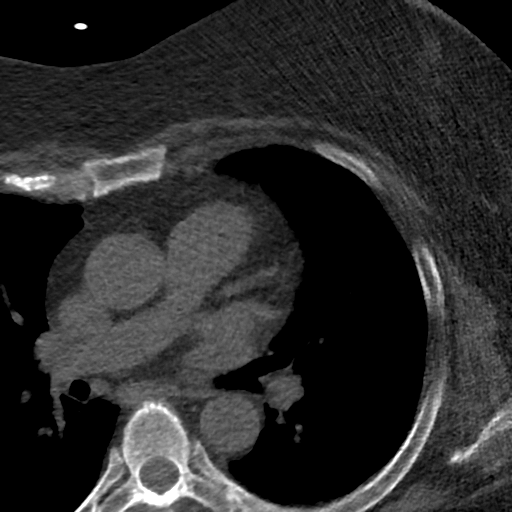

[Series 3: lung 71 % · axial · 0.76mm/px · z∈[-152,-62]mm · 5 of 46 slices shown]
[im 8/46  lung]
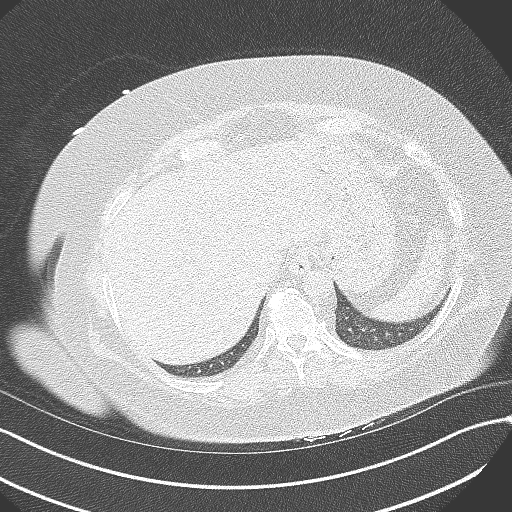
[im 16/46  lung]
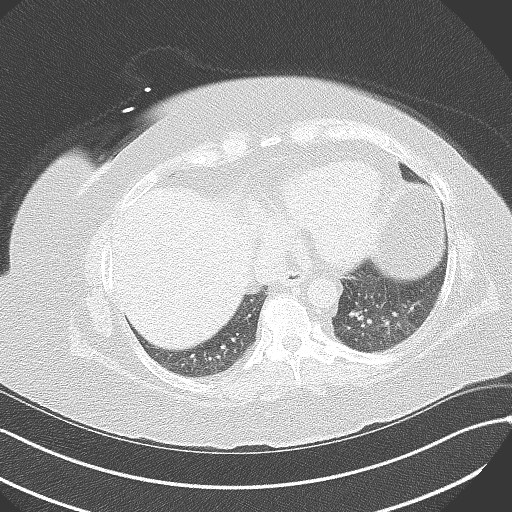
[im 23/46  lung]
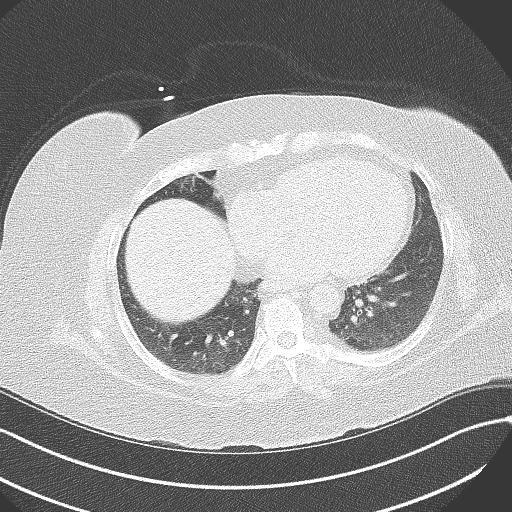
[im 31/46  lung]
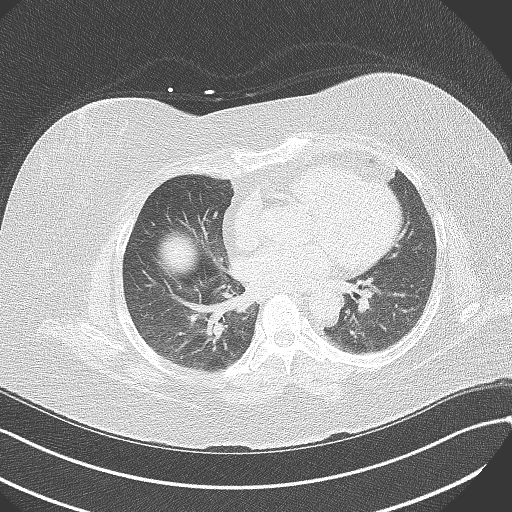
[im 38/46  lung]
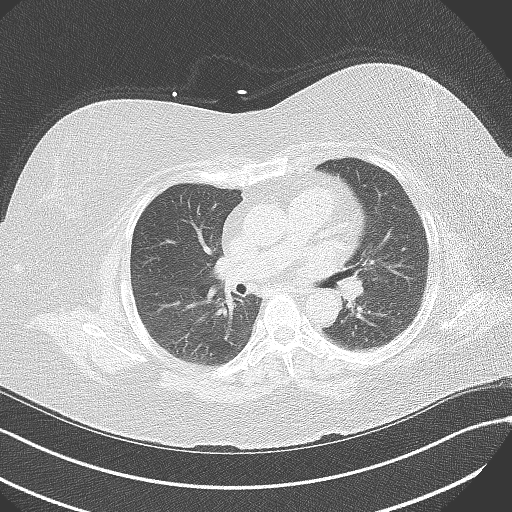

[Series 4: lung st 71 % · axial · 0.71mm/px · z∈[-152,-62]mm · 5 of 46 slices shown]
[im 8/46  lung]
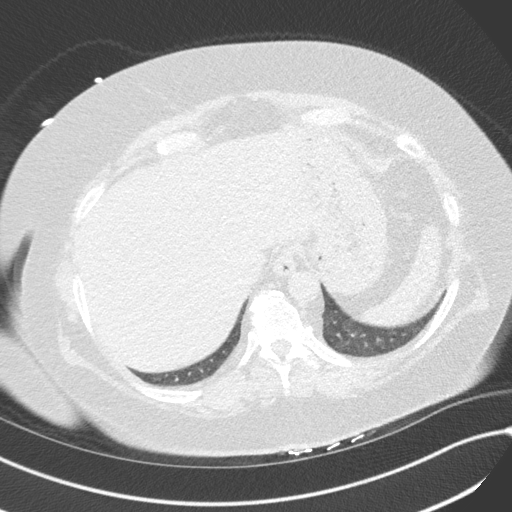
[im 16/46  lung]
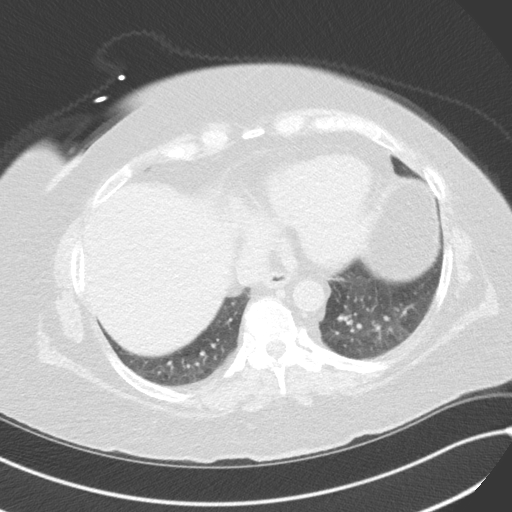
[im 23/46  lung]
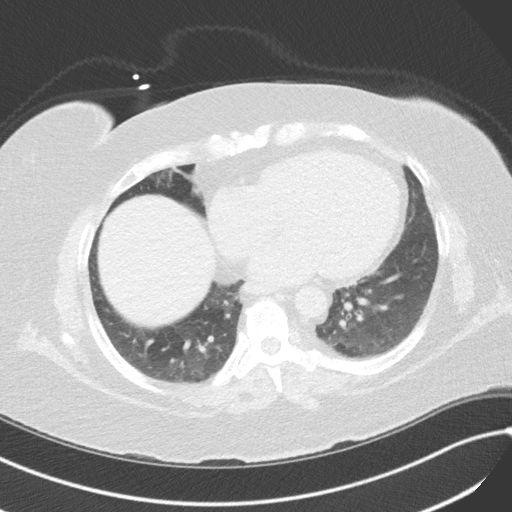
[im 31/46  lung]
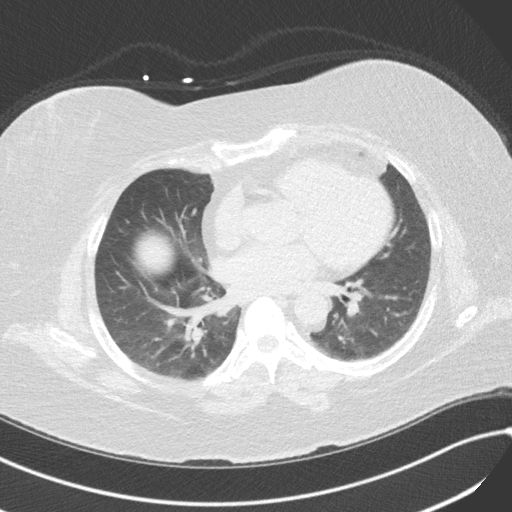
[im 38/46  lung]
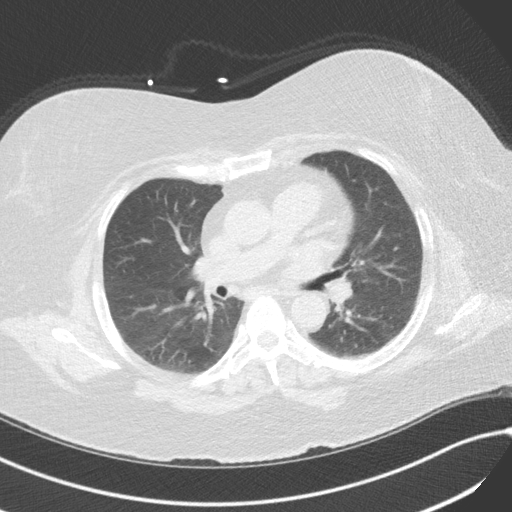

[14 of 20 positions shown; findings below may reference images not displayed]

FINDINGS: Vascular: Heart is normal size.  Aorta normal caliber.

Mediastinum/Nodes: No adenopathy.

Lungs/Pleura: No confluent airspace opacities or effusions.

Upper Abdomen: Imaging into the upper abdomen demonstrates no acute
findings.

Musculoskeletal: Chest wall soft tissues are unremarkable. No acute
bony abnormality.
IMPRESSION: No acute or significant extracardiac abnormality.
FINDINGS: Non-cardiac: See separate report from [REDACTED].

Ascending Aorta: Normal size.  No atherosclerosis.

Pericardium: Normal

Coronary arteries: Normal.
IMPRESSION: 1. Coronary calcium score of 0. This was 0 percentile for age and
sex matched control.

*** End of Addendum ***
EXAM:
OVER-READ INTERPRETATION  CT CHEST

The following report is an over-read performed by radiologist Dr.
Daniel Cristi Dpo [REDACTED] on 09/11/2020. This over-read
does not include interpretation of cardiac or coronary anatomy or
pathology. The coronary calcium score interpretation by the
cardiologist is attached.
FINDINGS: Vascular: Heart is normal size.  Aorta normal caliber.

Mediastinum/Nodes: No adenopathy.

Lungs/Pleura: No confluent airspace opacities or effusions.

Upper Abdomen: Imaging into the upper abdomen demonstrates no acute
findings.

Musculoskeletal: Chest wall soft tissues are unremarkable. No acute
bony abnormality.
IMPRESSION: No acute or significant extracardiac abnormality.

## 2022-07-11 ENCOUNTER — Encounter: Payer: Managed Care, Other (non HMO) | Admitting: Radiology

## 2022-08-01 HISTORY — PX: CERVICAL FUSION: SHX112

## 2023-01-26 ENCOUNTER — Ambulatory Visit: Payer: Self-pay

## 2023-01-26 ENCOUNTER — Other Ambulatory Visit: Payer: Self-pay

## 2023-01-26 ENCOUNTER — Other Ambulatory Visit: Payer: Self-pay | Admitting: Cardiovascular Disease

## 2023-01-26 ENCOUNTER — Encounter: Payer: Self-pay | Admitting: Emergency Medicine

## 2023-01-26 ENCOUNTER — Ambulatory Visit
Admission: EM | Admit: 2023-01-26 | Discharge: 2023-01-26 | Disposition: A | Discharge status: Home / Self Care | Payer: Managed Care, Other (non HMO) | Attending: Family Medicine | Admitting: Family Medicine

## 2023-01-26 DIAGNOSIS — R1030 Lower abdominal pain, unspecified: Secondary | ICD-10-CM

## 2023-01-26 DIAGNOSIS — N390 Urinary tract infection, site not specified: Secondary | ICD-10-CM | POA: Diagnosis present

## 2023-01-26 DIAGNOSIS — R8290 Unspecified abnormal findings in urine: Secondary | ICD-10-CM

## 2023-01-26 LAB — POCT URINALYSIS DIP (MANUAL ENTRY)
Glucose, UA: NEGATIVE mg/dL
Ketones, POC UA: NEGATIVE mg/dL
Nitrite, UA: NEGATIVE
Protein Ur, POC: 100 mg/dL — AB
Spec Grav, UA: 1.025 (ref 1.010–1.025)
Urobilinogen, UA: 0.2 E.U./dL
pH, UA: 6 (ref 5.0–8.0)

## 2023-01-26 MED ORDER — SULFAMETHOXAZOLE-TRIMETHOPRIM 800-160 MG PO TABS: 1.0000 | ORAL_TABLET | Freq: Two times a day (BID) | ORAL | 0 refills | Status: AC

## 2023-01-26 NOTE — ED Triage Notes (Addendum)
Pt reports intermittent lower abd pain with cramping that is now "more sore than anything when I have it" and fever since yesterday. Denies emesis, diarrhea.Pt reports history of similar. LBM this am. Has history of frequent laxative use and reports "I'm going to stop taking those and switch to miralax, I've messed my stomach all up over the last few years."

## 2023-01-26 NOTE — Discharge Instructions (Signed)
You appear to have a urinary tract infection today.  I have sent over an antibiotic to start treating this and we have sent out blood work and a urine culture for further information.  Someone will reach out if we need to further discuss any of these results or make any changes to your medication.  Follow-up with your primary care next week for a recheck and stay well-hydrated

## 2023-01-27 LAB — URINE CULTURE

## 2023-01-29 NOTE — ED Provider Notes (Signed)
[facility]    CSN: [id number] Arrival date & time: 01/26/23  4098      History   Chief Complaint Chief Complaint  Patient presents with   Abdominal Pain    HPI Gina Wood is a 62 y.o. female.   Presenting today with several day history of lower abdominal pains intermittently that are severe when they are on.  She states she also had a fever yesterday.  She has been taking laxatives off-and-on for quite some time and thinks this has irritated her bowels so she stopped them and has switched to MiraLAX.  Denies melena, nausea, vomiting, urinary symptoms, new foods or medications, recent travel, sick contacts.  She states she spoke to a friend who thinks she may have diverticulitis.  History of IBS, GERD not on medication for either.  No past abdominal surgeries per patient.    Past Medical History:  Diagnosis Date   Anxiety disorder    Chest pain, unspecified    GERD (gastroesophageal reflux disease)    HTN (hypertension)    IBS (irritable bowel syndrome)    Insomnia    Palpitations    Spinal stenosis of cervical region     Patient Active Problem List   Diagnosis Date Noted   HTN (hypertension)    Irritable bowel syndrome 12/06/2010   Palpitations 04/13/2009   CHEST PAIN-UNSPECIFIED 04/13/2009    Past Surgical History:  Procedure Laterality Date   CARPAL TUNNEL RELEASE  11/2021   OPEN REDUCTION INTERNAL FIXATION (ORIF) DISTAL RADIAL FRACTURE Right 05/16/2016   Procedure: OPEN REDUCTION INTERNAL FIXATION (ORIF) RIGHT  DISTAL RADIAL FRACTURE;  Surgeon: Cindee Salt, MD;  Location: [facility];  Service: Orthopedics;  Laterality: Right;    OB History     Gravida  0   Para  0   Term  0   Preterm  0   AB  0   Living  0      SAB  0   IAB  0   Ectopic  0   Multiple  0   Live Births  0            Home Medications    Prior to Admission medications   Medication Sig Start Date End Date Taking? Authorizing Provider   sulfamethoxazole-trimethoprim (BACTRIM DS) 800-160 MG tablet Take 1 tablet by mouth 2 (two) times daily for 3 days. 01/26/23 01/29/23 Yes Particia Nearing, PA-C  calcium-vitamin D (OSCAL WITH D) 500-200 MG-UNIT TABS tablet Take by mouth.    [provider]  omeprazole (PRILOSEC OTC) 20 MG tablet Take 40 mg by mouth at bedtime.    [provider]  propranolol (INDERAL) 80 MG tablet Take 1 tablet (80 mg total) by mouth daily. 05/09/22   Wendall Stade, MD    Family History Family History  Problem Relation Age of Onset   Uterine cancer Mother    Hypertension Father    Stroke Maternal Grandfather     Social History Social History   Tobacco Use   Smoking status: Never   Smokeless tobacco: Never  Vaping Use   Vaping status: Never Used  Substance Use Topics   Alcohol use: Yes    Alcohol/week: 14.0 standard drinks of alcohol    Types: 14 Glasses of wine per week   Drug use: No     Allergies   Penicillins   Review of Systems Review of Systems Per HPI  Physical Exam Triage Vital Signs ED Triage Vitals  Encounter Vitals Group     BP 01/26/23 0853 126/82     Systolic BP Percentile --      Diastolic BP Percentile --      Pulse Rate 01/26/23 0853 75     Resp 01/26/23 0853 20     Temp 01/26/23 0853 98.3 F (36.8 C)     Temp Source 01/26/23 0853 Oral     SpO2 --      Weight --      Height --      Head Circumference --      Peak Flow --      Pain Score 01/26/23 0851 0     Pain Loc --      Pain Education --      Exclude from Growth Chart --    No data found.  Updated Vital Signs BP 126/82 (BP Location: Left Arm)   Pulse 75   Temp 98.3 F (36.8 C) (Oral)   Resp 20   Visual Acuity Right Eye Distance:   Left Eye Distance:   Bilateral Distance:    Right Eye Near:   Left Eye Near:    Bilateral Near:     Physical Exam Vitals and nursing note reviewed.  Constitutional:      Appearance: Normal appearance. She is not ill-appearing.  HENT:      Head: Atraumatic.     Mouth/Throat:     Mouth: Mucous membranes are moist.  Eyes:     Extraocular Movements: Extraocular movements intact.     Conjunctiva/sclera: Conjunctivae normal.  Cardiovascular:     Rate and Rhythm: Normal rate and regular rhythm.     Heart sounds: Normal heart sounds.  Pulmonary:     Effort: Pulmonary effort is normal.     Breath sounds: Normal breath sounds.  Abdominal:     General: Bowel sounds are normal. There is no distension.     Palpations: Abdomen is soft.     Tenderness: There is abdominal tenderness. There is no right CVA tenderness, left CVA tenderness or guarding.     Comments: Mild lower abdominal tenderness to palpation in the suprapubic and left lower quadrant region without distention or guarding  Musculoskeletal:        General: Normal range of motion.     Cervical back: Normal range of motion and neck supple.  Skin:    General: Skin is warm and dry.  Neurological:     Mental Status: She is alert and oriented to person, place, and time.  Psychiatric:        Mood and Affect: Mood normal.        Thought Content: Thought content normal.        Judgment: Judgment normal.      UC Treatments / Results  Labs (all labs ordered are listed, but only abnormal results are displayed) Labs Reviewed  URINE CULTURE - Abnormal; Notable for the following components:      Result Value   Culture MULTIPLE SPECIES PRESENT, SUGGEST RECOLLECTION (*)    All other components within normal limits  CBC WITH DIFFERENTIAL/PLATELET - Abnormal; Notable for the following components:   WBC 16.7 (*)    Neutrophils Absolute 12.0 (*)    Monocytes Absolute 1.5 (*)    All other components within normal limits   Narrative:    Performed at:  [address] [employer] [address], [place], [place]  [id number] Lab Director: Jolene Schimke MD, Phone:  [phone]  COMPREHENSIVE METABOLIC PANEL - Abnormal;  Notable for the following components:   Glucose 136 (*)     CO2 16 (*)    All other components within normal limits   Narrative:    Performed at:  [address], [place], [place]  [id number] Lab Director: Jolene Schimke MD, Phone:  [phone]  POCT URINALYSIS DIP (MANUAL ENTRY) - Abnormal; Notable for the following components:   Color, UA brown (*)    Clarity, UA cloudy (*)    Bilirubin, UA small (*)    Blood, UA large (*)    Protein Ur, POC =100 (*)    Leukocytes, UA Large (3+) (*)    All other components within normal limits    EKG   Radiology No results found.  Procedures Procedures (including critical care time)  Medications Ordered in UC Medications - No data to display  Initial Impression / Assessment and Plan / UC Course  I have reviewed the triage vital signs and the nursing notes.  Pertinent labs & imaging results that were available during my care of the patient were reviewed by me and considered in my medical decision making (see chart for details).     Lower suspicion for something like diverticulitis given waxing and waning nature, currently without the pain so difficult to discern exact location when pain is on.  She does appear to have a urinary tract infection today on urinalysis and states she passed a small stone while getting her urine collected today.  This could possibly account for her pain and have caused a UTI.  Urine culture and labs pending for further evaluation, for now we will treat with Bactrim, fluids, rest, ibuprofen and Tylenol as needed.  Strict return precautions given for worsening symptoms.  Final Clinical Impressions(s) / UC Diagnoses   Final diagnoses:  Abnormal urinalysis  Lower abdominal pain  Acute lower UTI     Discharge Instructions      You appear to have a urinary tract infection today.  I have sent over an antibiotic to start treating this and we have sent out blood work and a urine culture for further information.  Someone will reach out if we need to  further discuss any of these results or make any changes to your medication.  Follow-up with your primary care next week for a recheck and stay well-hydrated    ED Prescriptions     Medication Sig Dispense Auth. Provider   sulfamethoxazole-trimethoprim (BACTRIM DS) 800-160 MG tablet Take 1 tablet by mouth 2 (two) times daily for 3 days. 6 tablet Particia Nearing, [place]      PDMP not reviewed this encounter.   Roosvelt Maser [place], [place] 01/29/23 [phone]

## 2023-01-30 DIAGNOSIS — C55 Malignant neoplasm of uterus, part unspecified: Secondary | ICD-10-CM

## 2023-01-30 HISTORY — DX: Malignant neoplasm of uterus, part unspecified: C55

## 2023-02-11 ENCOUNTER — Encounter: Payer: Managed Care, Other (non HMO) | Admitting: Obstetrics & Gynecology

## 2023-04-03 HISTORY — PX: ROBOTIC ASSISTED TOTAL HYSTERECTOMY: SHX6085

## 2023-05-08 ENCOUNTER — Ambulatory Visit: Payer: Managed Care, Other (non HMO) | Admitting: Student

## 2023-05-13 ENCOUNTER — Other Ambulatory Visit: Payer: Self-pay | Admitting: Cardiovascular Disease

## 2023-05-22 ENCOUNTER — Ambulatory Visit: Payer: Managed Care, Other (non HMO) | Admitting: Student

## 2023-05-28 ENCOUNTER — Other Ambulatory Visit: Payer: Self-pay

## 2023-05-28 DIAGNOSIS — Z131 Encounter for screening for diabetes mellitus: Secondary | ICD-10-CM

## 2023-05-28 DIAGNOSIS — Z13220 Encounter for screening for lipoid disorders: Secondary | ICD-10-CM

## 2023-05-28 DIAGNOSIS — Z79899 Other long term (current) drug therapy: Secondary | ICD-10-CM

## 2023-06-17 ENCOUNTER — Other Ambulatory Visit: Payer: Self-pay | Admitting: Medical Genetics

## 2023-07-22 ENCOUNTER — Ambulatory Visit: Payer: Managed Care, Other (non HMO) | Attending: Student | Admitting: Student

## 2023-07-22 ENCOUNTER — Encounter: Payer: Self-pay | Admitting: *Deleted

## 2023-07-22 ENCOUNTER — Encounter: Payer: Self-pay | Admitting: Student

## 2023-07-22 VITALS — BP 124/76 | HR 66 | Ht 60.0 in

## 2023-07-22 DIAGNOSIS — I10 Essential (primary) hypertension: Secondary | ICD-10-CM

## 2023-07-22 DIAGNOSIS — R002 Palpitations: Secondary | ICD-10-CM | POA: Diagnosis not present

## 2023-07-22 DIAGNOSIS — I6523 Occlusion and stenosis of bilateral carotid arteries: Secondary | ICD-10-CM | POA: Diagnosis not present

## 2023-07-22 DIAGNOSIS — Z13220 Encounter for screening for lipoid disorders: Secondary | ICD-10-CM | POA: Diagnosis not present

## 2023-07-22 MED ORDER — PROPRANOLOL HCL 80 MG PO TABS: 80.0000 mg | ORAL_TABLET | Freq: Every day | ORAL | 3 refills | Status: DC

## 2023-07-22 NOTE — Progress Notes (Signed)
Cardiology Office Note    Date:  07/22/2023  ID:  Leyah Stickles, DOB 09/14/1960, MRN [id number] Cardiologist: Charlton Haws, MD    History of Present Illness:    Gina Wood is a 63 y.o. female with past medical history of palpitations, HTN and GERD who presents to the office today for annual follow-up.  She was last examined by Dr. Eden Emms in 05/2022 and was overall doing well at that time. She had been on Propranolol 40 mg twice daily and this was transitioned to long-acting Propranolol ER 80 mg daily.  In the interim, by review of Care Everywhere she was diagnosed with endometrial cancer and underwent hysterectomy in 04/2023. Was also referred to radiation oncology and completed vaginal brachytherapy.  In talking with the patient today, she reports being under increased stress over the past several months given her medical issues and also ongoing stress at work. Reports her palpitations have overall been well-controlled with no recent symptoms. She has remained on short-acting Propranolol 80 mg and says she typically takes this at night. Was previously intolerant to extended release dosing. She did have a prior Rx for 10 mg to take as needed for palpitations but again has not utilized this recently. She denies any specific dyspnea on exertion, chest pain, orthopnea, PND or pitting edema.  Studies Reviewed:   EKG: EKG is not ordered today. She did have an EKG in 01/2023 and we have requested a copy of this.   Calcium Score: 08/2020 IMPRESSION: 1. Coronary calcium score of 0. This was 0 percentile for age and sex matched control.  Echocardiogram: 03/2022 IMPRESSIONS     1. Left ventricular ejection fraction, by estimation, is 60 to 65%. The  left ventricle has normal function. The left ventricle has no regional  wall motion abnormalities. Left ventricular diastolic parameters were  normal. The average left ventricular  global longitudinal strain is -18.2 %. The global longitudinal  strain is  normal.   2. Right ventricular systolic function is normal. The right ventricular  size is normal.   3. The mitral valve is normal in structure. Trivial mitral valve  regurgitation. No evidence of mitral stenosis.   4. The aortic valve is tricuspid. Aortic valve regurgitation is not  visualized. No aortic stenosis is present.   5. The inferior vena cava is normal in size with greater than 50%  respiratory variability, suggesting right atrial pressure of 3 mmHg.   Comparison(s): No prior Echocardiogram.    Carotid Dopplers: 05/2022 IMPRESSION: Color duplex indicates minimal homogeneous plaque, with no hemodynamically significant stenosis by duplex criteria in the extracranial cerebrovascular circulation.   Physical Exam:   VS:  BP 124/76 (BP Location: Left Arm, Patient Position: Sitting, Cuff Size: Large)   Pulse 66   Ht 5' (1.524 m)   SpO2 97%   BMI 45.50 kg/m    Wt Readings from Last 3 Encounters:  05/16/16 233 lb (105.7 kg)  02/24/13 175 lb (79.4 kg)  12/06/10 175 lb (79.4 kg)     GEN: Pleasant female appearing in no acute distress NECK: No JVD; No carotid bruits CARDIAC: RRR, no murmurs, rubs, gallops RESPIRATORY:  Clear to auscultation without rales, wheezing or rhonchi  ABDOMEN: Appears non-distended. No obvious abdominal masses. EXTREMITIES: No clubbing or cyanosis. No pitting edema.  Distal pedal pulses are 2+ bilaterally.   Assessment and Plan:   1. Palpitations - Symptoms have overall been well-controlled and she is maintaining normal sinus rhythm by examination today. We have requested a copy  of her recent EKG. She has not required additional doses of Propranolol.   2. HTN - Blood pressure is well-controlled at 124/76 during today's visit. Continue Propranolol 80 mg daily (has preferred to take this as once daily instead of twice daily dosing and previously did not tolerate extended release dosing).  3. Carotid Plaque/Screening for Lipid  Disorder/Screening for Type 2 DM - Prior carotid dopplers in 05/2022 showed minimal plaque with no significant stenosis. Would anticipate repeat imaging in the next 1 to 2 years for reassessment. She does have lab orders entered for repeat FLP and Hgb A1c for risk stratification.  Signed, Ellsworth Lennox, PA-C

## 2023-07-22 NOTE — Patient Instructions (Addendum)
Medication Instructions:  Your physician recommends that you continue on your current medications as directed. Please refer to the Current Medication list given to you today.  Labwork: none  Testing/Procedures: Please have lab work done that was requested in November 2024  Follow-Up: Your physician recommends that you schedule a follow-up appointment in: 1 year. You will receive a reminder call in about 10 months reminding you to schedule your appointment. If you don't receive this call, please contact our office.  Any Other Special Instructions Will Be Listed Below (If Applicable).  If you need a refill on your cardiac medications before your next appointment, please call your pharmacy.

## 2023-08-01 ENCOUNTER — Other Ambulatory Visit (HOSPITAL_COMMUNITY)
Admission: RE | Admit: 2023-08-01 | Discharge: 2023-08-01 | Disposition: A | Discharge status: Home / Self Care | Payer: Managed Care, Other (non HMO) | Source: Ambulatory Visit | Attending: Student | Admitting: Student

## 2023-08-01 DIAGNOSIS — Z79899 Other long term (current) drug therapy: Secondary | ICD-10-CM | POA: Diagnosis present

## 2023-08-01 DIAGNOSIS — Z131 Encounter for screening for diabetes mellitus: Secondary | ICD-10-CM | POA: Insufficient documentation

## 2023-08-01 DIAGNOSIS — Z13220 Encounter for screening for lipoid disorders: Secondary | ICD-10-CM | POA: Insufficient documentation

## 2023-08-01 LAB — LIPID PANEL
Cholesterol: 221 mg/dL — ABNORMAL HIGH (ref 0–200)
HDL: 72 mg/dL (ref >40)
LDL Cholesterol: 134 mg/dL — ABNORMAL HIGH (ref 0–99)
Total CHOL/HDL Ratio: 3.1 {ratio}
Triglycerides: 77 mg/dL (ref <150)
VLDL: 15 mg/dL (ref 0–40)

## 2023-08-01 LAB — TSH: TSH: 1.722 u[IU]/mL (ref 0.350–4.500)

## 2023-08-01 LAB — HEMOGLOBIN A1C
Hgb A1c MFr Bld: 5.9 % — ABNORMAL HIGH (ref 4.8–5.6)
Mean Plasma Glucose: 122.63 mg/dL

## 2023-08-11 LAB — GENECONNECT MOLECULAR SCREEN: Genetic Analysis Overall Interpretation: NEGATIVE

## 2024-04-08 ENCOUNTER — Other Ambulatory Visit: Payer: Self-pay

## 2024-04-08 DIAGNOSIS — I6523 Occlusion and stenosis of bilateral carotid arteries: Secondary | ICD-10-CM

## 2024-04-16 ENCOUNTER — Ambulatory Visit (HOSPITAL_COMMUNITY)

## 2024-04-19 ENCOUNTER — Ambulatory Visit (HOSPITAL_COMMUNITY)
Admission: RE | Admit: 2024-04-19 | Discharge: 2024-04-19 | Disposition: A | Discharge status: Home / Self Care | Source: Ambulatory Visit | Attending: Student | Admitting: Student

## 2024-04-19 ENCOUNTER — Ambulatory Visit: Payer: Self-pay | Admitting: Student

## 2024-04-19 DIAGNOSIS — I6523 Occlusion and stenosis of bilateral carotid arteries: Secondary | ICD-10-CM | POA: Diagnosis present

## 2024-04-20 NOTE — Progress Notes (Unsigned)
 Cardiology Office Note    Date:  04/21/2024  ID:  Gina Wood, DOB 1961-05-24, MRN [id number] Cardiologist: Maude Emmer, MD Cardiology APP:  Johnson Laymon HERO, PA-C { :  History of Present Illness:    Gina Wood is a 63 y.o. female with past medical history of palpitations, HTN, GERD, coronary calcium score of 0 by CT in 08/2020 and history of endometrial cancer (s/p hysterectomy and vaginal brachytherapy) who presents to the office today for annual follow-up.  She was last examined by myself in 07/2023 and had been under increased stress given her medical issues. Was taking short-acting Propranolol  80 mg and typically taking at night as she had previously been intolerant to extended release dosing. Prior carotid dopplers in 05/2022 had shown minimal plaque with no significant stenosis and was recommended to obtain repeat imaging in the next 1 to 2 years for reassessment. Follow-up dopplers were obtained earlier this month and showed mild atherosclerosis with less than 50% stenosis bilaterally.  In talking with the patient today, she has been under increased stress as she recently underwent cataract surgery and is also in the process of transitioning jobs. Reports her palpitations have overall been under good control and she denies any chest pain or dyspnea on exertion. No specific orthopnea, PND or pitting edema. Blood pressure was elevated at the time of recent cataract surgery but she does not believe that blood pressure was rechecked. She has made dietary changes since her last office visit and has lost over 20 pounds within the past year.  Studies Reviewed:   EKG: EKG is ordered today and demonstrates:   EKG Interpretation Date/Time:  Wednesday April 21 2024 08:24:09 EDT Ventricular Rate:  59 PR Interval:  184 QRS Duration:  80 QT Interval:  424 QTC Calculation: 419 R Axis:   59  Text Interpretation: Sinus bradycardia Low voltage QRS No acute ST changes. Confirmed by  Johnson Laymon ([id number]) on 04/21/2024 8:33:07 AM       Echocardiogram: 03/2022 IMPRESSIONS     1. Left ventricular ejection fraction, by estimation, is 60 to 65%. The  left ventricle has normal function. The left ventricle has no regional  wall motion abnormalities. Left ventricular diastolic parameters were  normal. The average left ventricular  global longitudinal strain is -18.2 %. The global longitudinal strain is  normal.   2. Right ventricular systolic function is normal. The right ventricular  size is normal.   3. The mitral valve is normal in structure. Trivial mitral valve  regurgitation. No evidence of mitral stenosis.   4. The aortic valve is tricuspid. Aortic valve regurgitation is not  visualized. No aortic stenosis is present.   5. The inferior vena cava is normal in size with greater than 50%  respiratory variability, suggesting right atrial pressure of 3 mmHg.   Comparison(s): No prior Echocardiogram.   Carotid Dopplers: 03/2024 IMPRESSION: 1. Right carotid system: Mild atherosclerotic changes with estimated stenosis measuring less than 50%. 2. Left carotid system: Mild atherosclerotic changes estimated stenosis measuring less than 50%.   Physical Exam:   VS:  BP 126/74   Pulse (!) 57   Ht 5' 3.5 (1.613 m)   SpO2 98%   BMI 40.63 kg/m    Wt Readings from Last 3 Encounters:  05/16/16 233 lb (105.7 kg)  02/24/13 175 lb (79.4 kg)  12/06/10 175 lb (79.4 kg)     GEN: Pleasant female appearing in no acute distress NECK: No JVD; No carotid bruits CARDIAC: RRR, no murmurs,  rubs, gallops RESPIRATORY:  Clear to auscultation without rales, wheezing or rhonchi  ABDOMEN: Appears non-distended. No obvious abdominal masses. EXTREMITIES: No clubbing or cyanosis. No pitting edema.  Distal pedal pulses are 2+ bilaterally.   Assessment and Plan:   1. Palpitations - Prior monitor in 01/2021 showed predominantly normal sinus rhythm with 1 episode of SVT and  rare PAC's and PVC's but less than 1% burden. Will recheck routine labs to include CBC, CMET and TSH as she does not currently have a PCP.  - She denies any recent palpitations. Continue Propranolol  80 mg daily. Previously intolerant to extended release dosing.  2. Carotid artery plaque, bilateral - Most recent carotid dopplers earlier this month showed mild atherosclerotic changes with less than 50% stenosis. Would plan for repeat imaging in 2 to 3 years for reassessment.  3. HTN - BP was initially recorded at 140/82, rechecked and improved to 126/74. Continue Propranolol  80 mg daily. If BP were to remain above goal, we will likely need to add an additional agent as unable to further titrate Propranolol  given her heart rate in the 50's and she was previously intolerant to long-acting Propranolol .  4. HLD - LDL was elevated at 134 when checked in 07/2023 and goal should be less than 70 given carotid plaque  She has preferred to avoid medical therapy for this and focus on dietary changes. Will recheck an FLP.   5. Prediabetes - Hgb A1c was at 5.9 when checked in 07/2023. She has made dietary changes in the interim and lost over 20 lbs! Will recheck Hgb A1c with upcoming labs.    Signed, Laymon CHRISTELLA Qua, PA-C

## 2024-04-21 ENCOUNTER — Other Ambulatory Visit (HOSPITAL_COMMUNITY)
Admission: RE | Admit: 2024-04-21 | Discharge: 2024-04-21 | Disposition: A | Discharge status: Home / Self Care | Source: Ambulatory Visit | Attending: Student | Admitting: Student

## 2024-04-21 ENCOUNTER — Ambulatory Visit: Payer: Self-pay | Admitting: Student

## 2024-04-21 ENCOUNTER — Encounter: Payer: Self-pay | Admitting: Student

## 2024-04-21 ENCOUNTER — Ambulatory Visit: Attending: Student | Admitting: Student

## 2024-04-21 VITALS — BP 126/74 | HR 57 | Ht 63.5 in

## 2024-04-21 DIAGNOSIS — E782 Mixed hyperlipidemia: Secondary | ICD-10-CM | POA: Insufficient documentation

## 2024-04-21 DIAGNOSIS — I6523 Occlusion and stenosis of bilateral carotid arteries: Secondary | ICD-10-CM | POA: Diagnosis not present

## 2024-04-21 DIAGNOSIS — R7303 Prediabetes: Secondary | ICD-10-CM | POA: Diagnosis present

## 2024-04-21 DIAGNOSIS — R002 Palpitations: Secondary | ICD-10-CM | POA: Insufficient documentation

## 2024-04-21 DIAGNOSIS — I10 Essential (primary) hypertension: Secondary | ICD-10-CM | POA: Diagnosis not present

## 2024-04-21 LAB — COMPREHENSIVE METABOLIC PANEL WITH GFR
ALT: 24 U/L (ref 0–44)
AST: 22 U/L (ref 15–41)
Albumin: 4.3 g/dL (ref 3.5–5.0)
Alkaline Phosphatase: 82 U/L (ref 38–126)
Anion gap: 11 (ref 5–15)
BUN: 22 mg/dL (ref 8–23)
CO2: 25 mmol/L (ref 22–32)
Calcium: 9.4 mg/dL (ref 8.9–10.3)
Chloride: 104 mmol/L (ref 98–111)
Creatinine, Ser: 0.81 mg/dL (ref 0.44–1.00)
GFR, Estimated: >60 mL/min (ref >60)
Glucose, Bld: 129 mg/dL — ABNORMAL HIGH (ref 70–99)
Potassium: 4.6 mmol/L (ref 3.5–5.1)
Sodium: 140 mmol/L (ref 135–145)
Total Bilirubin: 0.4 mg/dL (ref 0.0–1.2)
Total Protein: 7.2 g/dL (ref 6.5–8.1)

## 2024-04-21 LAB — TSH: TSH: 1.71 u[IU]/mL (ref 0.350–4.500)

## 2024-04-21 LAB — LIPID PANEL
Cholesterol: 223 mg/dL — ABNORMAL HIGH (ref 0–200)
HDL: 68 mg/dL (ref >40)
LDL Cholesterol: 135 mg/dL — ABNORMAL HIGH (ref 0–99)
Total CHOL/HDL Ratio: 3.3 ratio
Triglycerides: 100 mg/dL (ref <150)
VLDL: 20 mg/dL (ref 0–40)

## 2024-04-21 LAB — CBC
HCT: 44.4 % (ref 36.0–46.0)
Hemoglobin: 14.7 g/dL (ref 12.0–15.0)
MCH: 31.1 pg (ref 26.0–34.0)
MCHC: 33.1 g/dL (ref 30.0–36.0)
MCV: 94.1 fL (ref 80.0–100.0)
Platelets: 246 K/uL (ref 150–400)
RBC: 4.72 MIL/uL (ref 3.87–5.11)
RDW: 13.1 % (ref 11.5–15.5)
WBC: 7.3 K/uL (ref 4.0–10.5)
nRBC: 0 % (ref 0.0–0.2)

## 2024-04-21 LAB — HEMOGLOBIN A1C
Hgb A1c MFr Bld: 5.5 % (ref 4.8–5.6)
Mean Plasma Glucose: 111.15 mg/dL

## 2024-04-21 MED ORDER — PROPRANOLOL HCL 80 MG PO TABS: 80.0000 mg | ORAL_TABLET | Freq: Every day | ORAL | 3 refills | Status: AC

## 2024-04-21 NOTE — Patient Instructions (Signed)
 Medication Instructions:  Your physician recommends that you continue on your current medications as directed. Please refer to the Current Medication list given to you today.  *If you need a refill on your cardiac medications before your next appointment, please call your pharmacy*  Lab Work: Please have this done at [facility]. (hours-Monday through Friday from 8:00 am to 4:00 pm except 11:30 am to 12:10 pm)  If you have labs (blood work) drawn today and your tests are completely normal, you will receive your results only by: MyChart Message (if you have MyChart) OR A paper copy in the mail If you have any lab test that is abnormal or we need to change your treatment, we will call you to review the results.  Testing/Procedures: NONE    Follow-Up: At [facility], you and your health needs are our priority.  As part of our continuing mission to provide you with exceptional heart care, our providers are all part of one team.  This team includes your primary Cardiologist (physician) and Advanced Practice Providers or APPs (Physician Assistants and Nurse Practitioners) who all work together to provide you with the care you need, when you need it.  Your next appointment:   1 year(s)  Provider:   You may see Maude Emmer, MD or one of the following Advanced Practice Providers on your designated Care Team:   Laymon Qua, PA-C  [place], [place] Olivia Pavy, [place]     We recommend signing up for the patient portal called MyChart.  Sign up information is provided on this After Visit Summary.  MyChart is used to connect with patients for Virtual Visits (Telemedicine).  Patients are able to view lab/test results, encounter notes, upcoming appointments, etc.  Non-urgent messages can be sent to your provider as well.   To learn more about what you can do with MyChart, go to [email or web address].   Other Instructions Thank you for choosing  [facility]!
